# Patient Record
Sex: Male | Born: 1955 | Race: White | Hispanic: No | Marital: Married | Smoking: Never smoker
Health system: Southern US, Community
[De-identification: ages and names within clinical notes are randomized; demographics above are authoritative.]

## PROBLEM LIST (undated history)

## (undated) DIAGNOSIS — E7800 Pure hypercholesterolemia, unspecified: Secondary | ICD-10-CM

## (undated) DIAGNOSIS — I10 Essential (primary) hypertension: Secondary | ICD-10-CM

## (undated) DIAGNOSIS — G809 Cerebral palsy, unspecified: Secondary | ICD-10-CM

---

## 2013-10-14 ENCOUNTER — Emergency Department (INDEPENDENT_AMBULATORY_CARE_PROVIDER_SITE_OTHER)
Admission: EM | Admit: 2013-10-14 | Discharge: 2013-10-14 | Disposition: A | Discharge status: Home / Self Care | Payer: BC Managed Care – PPO | Source: Home / Self Care | Attending: Family Medicine | Admitting: Family Medicine

## 2013-10-14 ENCOUNTER — Encounter: Payer: Self-pay | Admitting: Emergency Medicine

## 2013-10-14 DIAGNOSIS — J069 Acute upper respiratory infection, unspecified: Secondary | ICD-10-CM

## 2013-10-14 DIAGNOSIS — R062 Wheezing: Secondary | ICD-10-CM

## 2013-10-14 HISTORY — DX: Pure hypercholesterolemia, unspecified: E78.00

## 2013-10-14 MED ORDER — DOXYCYCLINE HYCLATE 100 MG PO CAPS: 100.0000 mg | ORAL_CAPSULE | Freq: Two times a day (BID) | ORAL | Status: AC

## 2013-10-14 MED ORDER — METHYLPREDNISOLONE ACETATE 80 MG/ML IJ SUSP
80.0000 mg | Freq: Once | INTRAMUSCULAR | Status: AC
  Administered 2013-10-14: 80 mg via INTRAMUSCULAR

## 2013-10-14 NOTE — ED Notes (Signed)
Pt has had cough, nasal and chest congestion for 3 days.  Cough is sometimes productive and clear or yellow

## 2013-10-14 NOTE — ED Provider Notes (Signed)
CSN: [id number]     Arrival date & time 10/14/13  1140 History   First MD Initiated Contact with Patient 10/14/13 1153     Chief Complaint  Patient presents with  . Cough  . Nasal Congestion    HPI URI Symptoms Onset: 3-4 days  Description: rhinorrhea, nasal congestion, cough, mild wheezing  Modifying factors:  Baseline ? Asthma   Symptoms Nasal discharge: yes Fever: no Sore throat: no Cough: yes Wheezing: yes Ear pain: no GI symptoms: no Sick contacts: no  Red Flags  Stiff neck: no Dyspnea: no Rash: no Swallowing difficulty: no  Sinusitis Risk Factors Headache/face pain: no Double sickening: no tooth pain: no  Allergy Risk Factors Sneezing: no Itchy scratchy throat: no Seasonal symptoms: no  Flu Risk Factors Headache: no muscle aches: n severe fatigue: no   Past Medical History  Diagnosis Date  . High cholesterol    History reviewed. No pertinent past surgical history. History reviewed. No pertinent family history. History  Substance Use Topics  . Smoking status: Never Smoker   . Smokeless tobacco: Not on file  . Alcohol Use: No    Review of Systems  All other systems reviewed and are negative.    Allergies  Erythromycin  Home Medications   Current Outpatient Rx  Name  Route  Sig  Dispense  Refill  . esomeprazole (NEXIUM) 40 MG capsule   Oral   Take 40 mg by mouth daily at 12 noon.         Marland Kitchen HYDROcodone-acetaminophen (NORCO/VICODIN) 5-325 MG per tablet   Oral   Take 1 tablet by mouth every 6 (six) hours as needed for moderate pain.         . metaxalone (SKELAXIN) 800 MG tablet   Oral   Take 800 mg by mouth 3 (three) times daily.         . naproxen sodium (ANAPROX) 220 MG tablet   Oral   Take 220 mg by mouth 2 (two) times daily with a meal.         . pravastatin (PRAVACHOL) 20 MG tablet   Oral   Take 20 mg by mouth daily.         . silodosin (RAPAFLO) 8 MG CAPS capsule   Oral   Take 8 mg by mouth daily with  breakfast.          BP 121/90  Pulse 87  Temp(Src) 97.9 F (36.6 C) (Oral)  Ht 5\' 10"  (1.778 m)  Wt 199 lb 8 oz (90.493 kg)  BMI 28.63 kg/m2  SpO2 95% Physical Exam  Constitutional: He appears well-developed and well-nourished.  HENT:  Head: Normocephalic and atraumatic.  Right Ear: External ear normal.  Left Ear: External ear normal.  +nasal erythema, rhinorrhea bilaterally, + post oropharyngeal erythema    Eyes: Conjunctivae are normal. Pupils are equal, round, and reactive to light.  Neck: Normal range of motion. Neck supple.  Cardiovascular: Normal rate and regular rhythm.   Pulmonary/Chest: Effort normal. He has wheezes.  Abdominal: Soft.  Musculoskeletal: Normal range of motion.  Neurological: He is alert.  Skin: Skin is warm.    ED Course  Procedures (including critical care time) Labs Review Labs Reviewed - No data to display Imaging Review No results found.    MDM   1. URI (upper respiratory infection)   2. Wheezing    Likley viral source of sxs.  Depomedrol 80mg  IM x1 given wheezing.  Discussed supportive care and infectious/resp red flags.  PPx rx for doxy if sxs fail to improve/worsen.  Follow up as needed.     The patient and/or caregiver has been counseled thoroughly with regard to treatment plan and/or medications prescribed including dosage, schedule, interactions, rationale for use, and possible side effects and they verbalize understanding. Diagnoses and expected course of recovery discussed and will return if not improved as expected or if the condition worsens. Patient and/or caregiver verbalized understanding.         Doree AlbeeSteven Jalayna Josten, MD 10/14/13 1218

## 2013-12-06 ENCOUNTER — Encounter: Payer: Self-pay | Admitting: Emergency Medicine

## 2013-12-06 ENCOUNTER — Emergency Department (INDEPENDENT_AMBULATORY_CARE_PROVIDER_SITE_OTHER)
Admission: EM | Admit: 2013-12-06 | Discharge: 2013-12-06 | Disposition: A | Discharge status: Home / Self Care | Payer: BC Managed Care – PPO | Source: Home / Self Care | Attending: Family Medicine | Admitting: Family Medicine

## 2013-12-06 DIAGNOSIS — J069 Acute upper respiratory infection, unspecified: Secondary | ICD-10-CM

## 2013-12-06 HISTORY — DX: Essential (primary) hypertension: I10

## 2013-12-06 MED ORDER — PREDNISONE 20 MG PO TABS: 20.0000 mg | ORAL_TABLET | Freq: Two times a day (BID) | ORAL | Status: DC

## 2013-12-06 MED ORDER — BENZONATATE 200 MG PO CAPS: 200.0000 mg | ORAL_CAPSULE | Freq: Every day | ORAL | Status: DC

## 2013-12-06 MED ORDER — CEFDINIR 300 MG PO CAPS: 300.0000 mg | ORAL_CAPSULE | Freq: Two times a day (BID) | ORAL | Status: DC

## 2013-12-06 NOTE — ED Provider Notes (Signed)
CSN: [id number]     Arrival date & time 12/06/13  1650 History   First MD Initiated Contact with Patient 12/06/13 1657     Chief Complaint  Patient presents with  . Sinus Problem      HPI Comments: Yesterday patient became fatigued and developed runny nose and scratchy throat.  Last night he had chills/sweats.  He has now developed a mild cough and his ears feel clogged.  The history is provided by the patient.    Past Medical History  Diagnosis Date  . High cholesterol   . Hypertension     untreated   History reviewed. No pertinent past surgical history. History reviewed. No pertinent family history. History  Substance Use Topics  . Smoking status: Never Smoker   . Smokeless tobacco: Never Used  . Alcohol Use: No    Review of Systems + sore throat + cough No pleuritic pain + wheezing + nasal congestion + post-nasal drainage No sinus pain/pressure No itchy/red eyes No earache No hemoptysis No SOB No fever, + chills/sweats No nausea No vomiting No abdominal pain No diarrhea No urinary symptoms No skin rash + fatigue + myalgias No headache Used OTC meds without relief  Allergies  Erythromycin  Home Medications   Current Outpatient Rx  Name  Route  Sig  Dispense  Refill  . HYDROcodone-acetaminophen (NORCO/VICODIN) 5-325 MG per tablet   Oral   Take 1 tablet by mouth every 6 (six) hours as needed for moderate pain.         . metaxalone (SKELAXIN) 800 MG tablet   Oral   Take 800 mg by mouth 3 (three) times daily.         . naproxen sodium (ANAPROX) 220 MG tablet   Oral   Take 220 mg by mouth 2 (two) times daily with a meal.         . benzonatate (TESSALON) 200 MG capsule   Oral   Take 1 capsule (200 mg total) by mouth at bedtime. Take as needed for cough   12 capsule   0   . cefdinir (OMNICEF) 300 MG capsule   Oral   Take 1 capsule (300 mg total) by mouth 2 (two) times daily. (Rx void after 12/13/13)   20 capsule   0   . esomeprazole  (NEXIUM) 40 MG capsule   Oral   Take 40 mg by mouth daily at 12 noon.         . pravastatin (PRAVACHOL) 20 MG tablet   Oral   Take 20 mg by mouth daily.         . predniSONE (DELTASONE) 20 MG tablet   Oral   Take 1 tablet (20 mg total) by mouth 2 (two) times daily. Take with food.   10 tablet   0   . silodosin (RAPAFLO) 8 MG CAPS capsule   Oral   Take 8 mg by mouth daily with breakfast.          BP 149/96  Pulse 92  Temp(Src) 98.1 F (36.7 C) (Oral)  Resp 16  Wt 200 lb (90.719 kg)  SpO2 97% Physical Exam Nursing notes and Vital Signs reviewed. Appearance:  Patient appears stated age, and in no acute distress Eyes:  Pupils are equal, round, and reactive to light and accomodation.  Extraocular movement is intact.  Conjunctivae are not inflamed  Ears:  Canals normal.  Tympanic membranes normal.  Nose:  Mildly congested turbinates.  No sinus tenderness.   Pharynx:  Normal Neck:  Supple.   Enlarged non-tender posterior nodes are palpated bilaterally  Lungs:  Clear to auscultation.  Breath sounds are equal.  Heart:  Regular rate and rhythm without murmurs, rubs, or gallops.  Abdomen:  Nontender without masses or hepatosplenomegaly.  Bowel sounds are present.  No CVA or flank tenderness.  Extremities:  No edema.  No calf tenderness Skin:  No rash present.   ED Course  Procedures  none      MDM   1. Acute upper respiratory infections of unspecified site; suspect early viral URI   (Note elevated BP)  There is no evidence of bacterial infection today.  Begin prednisone burst.  Prescription written for Benzonatate (Tessalon) to take at bedtime for night-time cough.  Take plain Mucinex (1200 mg guaifenesin) twice daily for cough and congestion.  Increase fluid intake, rest. May use Afrin nasal spray (or generic oxymetazoline) twice daily for about 5 days.  Also recommend using saline nasal spray several times daily and saline nasal irrigation (AYR is a common brand) Try  warm salt water gargles for sore throat.  Stop all antihistamines for now, and other non-prescription cough/cold preparations. May take Tylenol, or Ibuprofen 200mg , 4 tabs every 8 hours with food for body aches, fever, etc.   Begin Cefdinir if not improving about one week or if persistent fever develops (Given a prescription to hold, with an expiration date)  Follow-up with family doctor if not improving 7 to 10 days.  Follow-up with family doctor for blood pressure.    Lattie HawStephen A Beese, MD 12/06/13 [phone]

## 2013-12-06 NOTE — ED Notes (Signed)
[facility] c/o congestion, sneezing, runny nose, allergy symptoms without fever x yesterday. Taken OTC [facility] generic allergy relief.

## 2013-12-06 NOTE — Discharge Instructions (Signed)
Take plain Mucinex (1200 mg guaifenesin) twice daily for cough and congestion.  Increase fluid intake, rest. May use Afrin nasal spray (or generic oxymetazoline) twice daily for about 5 days.  Also recommend using saline nasal spray several times daily and saline nasal irrigation (AYR is a common brand) Try warm salt water gargles for sore throat.  Stop all antihistamines for now, and other non-prescription cough/cold preparations. May take Tylenol, or Ibuprofen 200mg , 4 tabs every 8 hours with food for body aches, fever, etc.   Begin Cefdinir if not improving about one week or if persistent fever develops   Follow-up with family doctor if not improving 7 to 10 days.  Follow-up with family doctor for blood pressure.

## 2014-05-18 ENCOUNTER — Encounter: Payer: Self-pay | Admitting: Emergency Medicine

## 2014-05-18 ENCOUNTER — Emergency Department (INDEPENDENT_AMBULATORY_CARE_PROVIDER_SITE_OTHER)
Admission: EM | Admit: 2014-05-18 | Discharge: 2014-05-18 | Disposition: A | Discharge status: Home / Self Care | Payer: BC Managed Care – PPO | Source: Home / Self Care | Attending: Emergency Medicine | Admitting: Emergency Medicine

## 2014-05-18 DIAGNOSIS — L02419 Cutaneous abscess of limb, unspecified: Secondary | ICD-10-CM

## 2014-05-18 DIAGNOSIS — L03115 Cellulitis of right lower limb: Secondary | ICD-10-CM

## 2014-05-18 DIAGNOSIS — L03119 Cellulitis of unspecified part of limb: Secondary | ICD-10-CM

## 2014-05-18 MED ORDER — DOXYCYCLINE HYCLATE 100 MG PO CAPS: 100.0000 mg | ORAL_CAPSULE | Freq: Two times a day (BID) | ORAL | Status: DC

## 2014-05-18 MED ORDER — CEFTRIAXONE SODIUM 1 G IJ SOLR
1.0000 g | INTRAMUSCULAR | Status: AC
  Administered 2014-05-18: 1 g via INTRAMUSCULAR

## 2014-05-18 NOTE — ED Notes (Signed)
Pt c/o RT foot pain, redness and swelling x last night. Denies injury. He reports a hx of cellulitis.

## 2014-05-18 NOTE — ED Provider Notes (Signed)
CSN: [id number]     Arrival date & time 05/18/14  0836 History   First MD Initiated Contact with Patient 05/18/14 [phone]     Chief Complaint  Patient presents with  . Foot Pain   (Consider location/radiation/quality/duration/timing/severity/associated sxs/prior Treatment) HPI Recalls no specific injury. Complains of 2 days of progressively worsening right medial foot and lower leg pain redness and swelling. No open wound or drainage or bleeding. The pain is moderate, soreness. Worse to touch. He is able to weight-bear  Denies numbness or tingling or focal neurologic symptoms Denies history of gout or arthritis or joint abnormalities. A couple years ago, had similar symptoms and was diagnosed with cellulitis by his doctor, which resolved with an antibiotic  Past Medical History  Diagnosis Date  . High cholesterol   . Hypertension     untreated   History reviewed. No pertinent past surgical history. History reviewed. No pertinent family history. History  Substance Use Topics  . Smoking status: Never Smoker   . Smokeless tobacco: Never Used  . Alcohol Use: No    Review of Systems  All other systems reviewed and are negative.   Allergies  Erythromycin  Home Medications   Prior to Admission medications   Medication Sig Start Date End Date Taking? Authorizing Provider  benzonatate (TESSALON) 200 MG capsule Take 1 capsule (200 mg total) by mouth at bedtime. Take as needed for cough 12/06/13   Lattie Haw, MD  cefdinir (OMNICEF) 300 MG capsule Take 1 capsule (300 mg total) by mouth 2 (two) times daily. (Rx void after 12/13/13) 12/06/13   Lattie Haw, MD  doxycycline (VIBRAMYCIN) 100 MG capsule Take 1 capsule (100 mg total) by mouth 2 (two) times daily. 05/18/14   Lajean Manes, MD  esomeprazole (NEXIUM) 40 MG capsule Take 40 mg by mouth daily at 12 noon.    Historical Provider, MD  HYDROcodone-acetaminophen (NORCO/VICODIN) 5-325 MG per tablet Take 1 tablet by mouth every 6 (six)  hours as needed for moderate pain.    Historical Provider, MD  metaxalone (SKELAXIN) 800 MG tablet Take 800 mg by mouth 3 (three) times daily.    Historical Provider, MD  naproxen sodium (ANAPROX) 220 MG tablet Take 220 mg by mouth 2 (two) times daily with a meal.    Historical Provider, MD  pravastatin (PRAVACHOL) 20 MG tablet Take 20 mg by mouth daily.    Historical Provider, MD  predniSONE (DELTASONE) 20 MG tablet Take 1 tablet (20 mg total) by mouth 2 (two) times daily. Take with food. 12/06/13   Lattie Haw, MD  silodosin (RAPAFLO) 8 MG CAPS capsule Take 8 mg by mouth daily with breakfast.    Historical Provider, MD   BP 119/83  Pulse 116  Temp(Src) 97.5 F (36.4 C) (Oral)  Resp 18  Wt 201 lb (91.173 kg)  SpO2 98% Physical Exam  Nursing note and vitals reviewed. Constitutional: He is oriented to person, place, and time. He appears well-developed and well-nourished. No distress.  HENT:  Head: Normocephalic and atraumatic.  Eyes: Conjunctivae and EOM are normal. Pupils are equal, round, and reactive to light. No scleral icterus.  Neck: Normal range of motion.  Cardiovascular: Normal rate.   Pulmonary/Chest: Effort normal.  Abdominal: He exhibits no distension.  Musculoskeletal: Normal range of motion.       Right lower leg: He exhibits tenderness and swelling. He exhibits no bony tenderness, no edema, no deformity and no laceration.       Legs: Right  anterior lower leg: Skin is warm, red, indurated with 1 red streak as depicted. No fluctuance. No open wound. No calf tenderness. Homans sign negative. No foot tenderness or swelling. No joint abnormalities or tenderness noted. Range of motion intact. Neurovascular distally intact.  No tenderness or swelling right popliteal area. No tenderness or abnormality of right thigh. No inguinal adenopathy.  Neurological: He is alert and oriented to person, place, and time.  Skin: Skin is warm.  Psychiatric: He has a normal mood and  affect.   Pulse rechecked at 88 ED Course  Procedures (including critical care time) Labs Review Labs Reviewed - No data to display  Imaging Review No results found.   MDM   1. Cellulitis of leg, right    I also suspect early ascending lymphangitis right lower leg. No systemic symptoms or findings.  Treatment options discussed, as well as risks, benefits, alternatives. Patient voiced understanding and agreement with the following plans: Rocephin 1 g IM stat Doxycycline 100 mg twice a day Keep right leg elevated  Follow-up with your primary care doctor in 2-3 days if not improving, or sooner if symptoms become worse. Precautions discussed. Red flags discussed.--- Emergency room if any red flags Questions invited and answered. Patient voiced understanding and agreement.     Lajean Manes, MD 05/18/14 [phone]

## 2014-05-24 ENCOUNTER — Ambulatory Visit (HOSPITAL_BASED_OUTPATIENT_CLINIC_OR_DEPARTMENT_OTHER): Payer: BC Managed Care – PPO | Attending: Emergency Medicine

## 2014-05-24 ENCOUNTER — Emergency Department (INDEPENDENT_AMBULATORY_CARE_PROVIDER_SITE_OTHER)
Admission: EM | Admit: 2014-05-24 | Discharge: 2014-05-24 | Disposition: A | Discharge status: Home / Self Care | Payer: BC Managed Care – PPO | Source: Home / Self Care | Attending: Emergency Medicine | Admitting: Emergency Medicine

## 2014-05-24 ENCOUNTER — Encounter: Payer: Self-pay | Admitting: Emergency Medicine

## 2014-05-24 DIAGNOSIS — L03115 Cellulitis of right lower limb: Secondary | ICD-10-CM

## 2014-05-24 DIAGNOSIS — I83893 Varicose veins of bilateral lower extremities with other complications: Secondary | ICD-10-CM | POA: Insufficient documentation

## 2014-05-24 DIAGNOSIS — L03119 Cellulitis of unspecified part of limb: Secondary | ICD-10-CM

## 2014-05-24 DIAGNOSIS — M79609 Pain in unspecified limb: Secondary | ICD-10-CM

## 2014-05-24 DIAGNOSIS — L03125 Acute lymphangitis of right lower limb: Secondary | ICD-10-CM

## 2014-05-24 DIAGNOSIS — R609 Edema, unspecified: Secondary | ICD-10-CM | POA: Diagnosis present

## 2014-05-24 DIAGNOSIS — L02419 Cutaneous abscess of limb, unspecified: Secondary | ICD-10-CM

## 2014-05-24 DIAGNOSIS — M79604 Pain in right leg: Secondary | ICD-10-CM

## 2014-05-24 MED ORDER — AMOXICILLIN-POT CLAVULANATE 875-125 MG PO TABS: 1.0000 | ORAL_TABLET | Freq: Two times a day (BID) | ORAL | Status: DC

## 2014-05-24 MED ORDER — CEFTRIAXONE SODIUM 1 G IJ SOLR
1.0000 g | Freq: Once | INTRAMUSCULAR | Status: AC
  Administered 2014-05-24: 1 g via INTRAMUSCULAR

## 2014-05-24 NOTE — ED Notes (Signed)
Follow up visit from a week ago for same problem.  Redness with pitting edema to right lower leg.  Patient states that although there has been some improvement he is still having the symptoms mentioned.  Patient also reports a "knot" to posterior mid calf area and is painful.

## 2014-05-24 NOTE — ED Provider Notes (Signed)
CSN: [id number]     Arrival date & time 05/24/14  1703 History   First MD Initiated Contact with Patient 05/24/14 1743     Chief Complaint  Patient presents with  . Leg Pain    HPI Mr. Wishon was seen here at [facility] urgent care on 05/18/14, diagnosed with cellulitis, suspicion of early ascending lymphangitis right lower leg. He had no cords or calf pain and Homans sign was negative and he had no signs or symptoms of DVT at that time. I've extensively reviewed my notes from that visit on 9/11.--He received Rocephin 1 g IM then, and a prescription for doxycycline 100 twice a day, which he continues to take  Mr. Plitt returns here to [facility] urgent care, complaining that the right leg pain initially improved a little with rest and elevation and treatment for a few days after 9/11, however he returned to work for the past several days and has worked long days on his feet everyday and notes at the end of the day the swelling and pain worsens. Today, the swelling and pain has worsened to a pain level of 5/10 (he states that back in 9/11, the pain level was 7/10). Just today, he noted some hard knots in the medial aspect of right leg. The right leg has stayed warm and firm and red, but no swelling or pain or redness above the right knee. He denies chest pain or shortness of breath or palpitations syncope or focal neurologic symptoms. Past Medical History  Diagnosis Date  . High cholesterol   . Hypertension     untreated   History reviewed. No pertinent past surgical history. History reviewed. No pertinent family history. History  Substance Use Topics  . Smoking status: Never Smoker   . Smokeless tobacco: Never Used  . Alcohol Use: No    Review of Systems  All other systems reviewed and are negative.   Allergies  Erythromycin  Home Medications   Prior to Admission medications   Medication Sig Start Date End Date Taking? Authorizing Provider  amoxicillin-clavulanate  (AUGMENTIN) 875-125 MG per tablet Take 1 tablet by mouth 2 (two) times daily. For 10 days. Take with food. 05/24/14   Lajean Manes, MD  benzonatate (TESSALON) 200 MG capsule Take 1 capsule (200 mg total) by mouth at bedtime. Take as needed for cough 12/06/13   Lattie Haw, MD  cefdinir (OMNICEF) 300 MG capsule Take 1 capsule (300 mg total) by mouth 2 (two) times daily. (Rx void after 12/13/13) 12/06/13   Lattie Haw, MD  doxycycline (VIBRAMYCIN) 100 MG capsule Take 1 capsule (100 mg total) by mouth 2 (two) times daily. 05/18/14   Lajean Manes, MD  esomeprazole (NEXIUM) 40 MG capsule Take 40 mg by mouth daily at 12 noon.    Historical Provider, MD  HYDROcodone-acetaminophen (NORCO/VICODIN) 5-325 MG per tablet Take 1 tablet by mouth every 6 (six) hours as needed for moderate pain.    Historical Provider, MD  metaxalone (SKELAXIN) 800 MG tablet Take 800 mg by mouth 3 (three) times daily.    Historical Provider, MD  naproxen sodium (ANAPROX) 220 MG tablet Take 220 mg by mouth 2 (two) times daily with a meal.    Historical Provider, MD  pravastatin (PRAVACHOL) 20 MG tablet Take 20 mg by mouth daily.    Historical Provider, MD  predniSONE (DELTASONE) 20 MG tablet Take 1 tablet (20 mg total) by mouth 2 (two) times daily. Take with food. 12/06/13   Lattie Haw,  MD  silodosin (RAPAFLO) 8 MG CAPS capsule Take 8 mg by mouth daily with breakfast.    Historical Provider, MD   BP 128/90  Pulse 73  Temp(Src) 97.5 F (36.4 C) (Oral)  Resp 16  Ht  (1.778 m)  Wt 197 lb (89.359 kg)  BMI 28.27 kg/m2  SpO2 99% Physical Exam  Nursing note and vitals reviewed. Constitutional: He is oriented to person, place, and time. He appears well-developed and well-nourished. No distress.  Uncomfortable from right leg pain, but no acute distress. He can ambulate normally.  HENT:  Head: Normocephalic and atraumatic.  Mouth/Throat: Oropharynx is clear and moist.  Eyes: Conjunctivae and EOM are normal. Pupils are  equal, round, and reactive to light. No scleral icterus.  Neck: Normal range of motion.  Cardiovascular: Normal rate, regular rhythm and normal heart sounds.   Pulmonary/Chest: Effort normal and breath sounds normal.  Abdominal: He exhibits no distension.  Musculoskeletal: Normal range of motion.       Right ankle: Normal. No tenderness.       Right lower leg: He exhibits tenderness and edema (Trace). He exhibits no bony tenderness, no swelling, no deformity and no laceration.       Legs:      Right foot: Normal. He exhibits no tenderness, no bony tenderness, no swelling and normal capillary refill.  As depicted, Right lower leg:  Anterior -medial induration, redness, heat, moderate tenderness, with a firm linear cord palpated longitudinally. No fluctuance or drainage or bleeding or open wound. Trace pretibial edema. Negative Homans sign. No posterior calf tenderness or swelling or heat or cords. No popliteal swelling or tenderness or redness or heat or cords. Right knee exam within normal limits. Right thigh exam normal. Nontender without swelling or heat or cords.   Lymphadenopathy:       Right: No inguinal adenopathy present.       Left: No inguinal adenopathy present.  Neurological: He is alert and oriented to person, place, and time.  Neurovascular distally intact lower extremities  Skin: Skin is warm. He is not diaphoretic.  Psychiatric: He has a normal mood and affect.    ED Course  Procedures (including critical care time) Labs Review Labs Reviewed - No data to display  MDM   1. Leg pain, right   2. Cellulitis of leg, right   3. Acute lymphangitis of right lower extremity     05/24/2014 5:45 PM Explained to patient: Primary diagnosis is still likely cellulitis and lymphangitis right leg, however because he still has pain and swelling right leg ,with tender cords right anterior medial leg, we need to rule out DVT.  His response to treatment has been complicated by his  continuing to work and being on his feet all day for the past 3 days which has exacerbated the pain and swelling. Therefore, we called and arranged stat ultrasound -venous Doppler of right leg at [facility] (this imaging test is not available here in our facility in [place] at this time the evening.) Instructed patient to go to [facility] now (written and verbal instructions), Right leg venous Doppler will be done stat.--- If the test is positive for DVT, he will need to start Lovenox. If negative, we can offer the option of  evaluation and treatment by EDP at [facility] ER , with the option of Rocephin 1 g IM , or the option of followup here at [facility] urgent care tomorrow morning--I have gone  ahead and sent an additional antibiotic to his pharmacy: Augmentin 875 twice a day, which gives aerobic and anaerobic coverage-This is in addition to the doxycycline that he started taking.--He voiced understanding and agreement with this plan. I wrote a handwritten note excusing from work 9/17 through 05/28/14. I explained the importance of keeping his right leg elevated, as his going to work and being on his feet all day has tended to exacerbate the pain and swelling.  7:20 PM. Phone report that ultrasound right leg shows no evidence of DVT.  05/24/2014   CLINICAL DATA:  Right lower extremity redness and edema.  EXAM: RIGHT LOWER EXTREMITY VENOUS DOPPLER ULTRASOUND  TECHNIQUE: Gray-scale sonography with graded compression, as well as color Doppler and duplex ultrasound were performed to evaluate the lower extremity deep venous systems from the level of the common femoral vein and including the common femoral, femoral, profunda femoral, popliteal and calf veins including the posterior tibial, peroneal and gastrocnemius veins when visible. The superficial great saphenous vein was also interrogated. Spectral Doppler was utilized to evaluate flow at rest and with distal  augmentation maneuvers in the common femoral, femoral and popliteal veins.  COMPARISON:  None.  FINDINGS: Common Femoral Vein: No evidence of thrombus. Normal compressibility, respiratory phasicity and response to augmentation.  Saphenofemoral Junction: No evidence of thrombus. Normal compressibility and flow on color Doppler imaging.  Profunda Femoral Vein: No evidence of thrombus. Normal compressibility and flow on color Doppler imaging.  Femoral Vein: No evidence of thrombus. Normal compressibility, respiratory phasicity and response to augmentation.  Popliteal Vein: No evidence of thrombus. Normal compressibility, respiratory phasicity and response to augmentation.  Calf Veins: No evidence of thrombus. Normal compressibility and flow on color Doppler imaging.  Superficial Great Saphenous Vein: No evidence of thrombus. Normal compressibility and flow on color Doppler imaging.  Venous Reflux:  None.  Other Findings: The area of palpable concern correlates with superficial varicose veins.  IMPRESSION: 1. No evidence for deep vein thrombosis. 2. Palpable abnormality in the calf is varicose vein.   Electronically Signed   By: Rosalie Gums M.D.   On: 05/24/2014 18:58   I called and explained to patient that ultrasound negative for DVT. He'll drive back to [facility] urgent care now for Rocephin 1 g IM stat. Augmentin 875 twice a day. See other plans as noted in my note of 5:45 PM above.  7:35 PM. Patient returned here at [facility] urgent care and we discussed the above at length. Questions invited and answered. Rocephin 1 g IM stat given. His wife has already picked up the Augmentin prescription. He has some hydrocodone left over from prior prescription, to use when necessary severe pain, but otherwise Tylenol when necessary pain.  He prefers to followup for this problem here at [facility] urgent care on Monday 9/21, advise going to emergency room if any worsening or new symptoms or any red  flags Precautions discussed. Red flags discussed. Questions invited and answered. Patient voiced understanding and agreement.  Over 40 minutes spent today, greater than 50% of the time spent for counseling and coordination of care.     Lajean Manes, MD 05/24/14 1949

## 2014-05-26 DIAGNOSIS — I83893 Varicose veins of bilateral lower extremities with other complications: Secondary | ICD-10-CM | POA: Diagnosis not present

## 2014-05-27 ENCOUNTER — Telehealth: Payer: Self-pay | Admitting: Emergency Medicine

## 2014-05-28 ENCOUNTER — Encounter: Payer: Self-pay | Admitting: Emergency Medicine

## 2014-05-28 ENCOUNTER — Emergency Department (INDEPENDENT_AMBULATORY_CARE_PROVIDER_SITE_OTHER)
Admission: EM | Admit: 2014-05-28 | Discharge: 2014-05-28 | Disposition: A | Discharge status: Home / Self Care | Payer: BC Managed Care – PPO | Source: Home / Self Care | Attending: Emergency Medicine | Admitting: Emergency Medicine

## 2014-05-28 DIAGNOSIS — L03115 Cellulitis of right lower limb: Secondary | ICD-10-CM

## 2014-05-28 DIAGNOSIS — L02419 Cutaneous abscess of limb, unspecified: Secondary | ICD-10-CM

## 2014-05-28 DIAGNOSIS — L03129 Acute lymphangitis of unspecified part of limb: Secondary | ICD-10-CM

## 2014-05-28 DIAGNOSIS — L03119 Cellulitis of unspecified part of limb: Secondary | ICD-10-CM

## 2014-05-28 HISTORY — DX: Cerebral palsy, unspecified: G80.9

## 2014-05-28 NOTE — ED Provider Notes (Signed)
CSN: [id number]     Arrival date & time 05/28/14  4782 History   First MD Initiated Contact with Patient 05/28/14 [phone]     Chief Complaint  Patient presents with  . Wound Check   (Consider location/radiation/quality/duration/timing/severity/associated sxs/prior Treatment) HPI Here to recheck right leg infection, cellulitis, ascending lymphangitis. Reviewed extensive notes and history from 05/24/14. Since then, he feels no significant improvement. Still swollen and red and swelling worsens when he stands up and improves somewhat when he elevates right leg. No definite fever or chills. Past Medical History  Diagnosis Date  . High cholesterol   . Hypertension     untreated  . Cerebral palsy    History reviewed. No pertinent past surgical history. History reviewed. No pertinent family history. History  Substance Use Topics  . Smoking status: Never Smoker   . Smokeless tobacco: Never Used  . Alcohol Use: No    Review of Systems  All other systems reviewed and are negative.   Allergies  Erythromycin  Home Medications   Prior to Admission medications   Medication Sig Start Date End Date Taking? Authorizing Provider  amoxicillin-clavulanate (AUGMENTIN) 875-125 MG per tablet Take 1 tablet by mouth 2 (two) times daily. For 10 days. Take with food. 05/24/14   Lajean Manes, MD  benzonatate (TESSALON) 200 MG capsule Take 1 capsule (200 mg total) by mouth at bedtime. Take as needed for cough 12/06/13   Lattie Haw, MD  doxycycline (VIBRAMYCIN) 100 MG capsule Take 1 capsule (100 mg total) by mouth 2 (two) times daily. 05/18/14   Lajean Manes, MD  esomeprazole (NEXIUM) 40 MG capsule Take 40 mg by mouth daily at 12 noon.    Historical Provider, MD  HYDROcodone-acetaminophen (NORCO/VICODIN) 5-325 MG per tablet Take 1 tablet by mouth every 6 (six) hours as needed for moderate pain.    Historical Provider, MD  metaxalone (SKELAXIN) 800 MG tablet Take 800 mg by mouth 3 (three) times daily.     Historical Provider, MD  naproxen sodium (ANAPROX) 220 MG tablet Take 220 mg by mouth 2 (two) times daily with a meal.    Historical Provider, MD  pravastatin (PRAVACHOL) 20 MG tablet Take 20 mg by mouth daily.    Historical Provider, MD  silodosin (RAPAFLO) 8 MG CAPS capsule Take 8 mg by mouth daily with breakfast.    Historical Provider, MD   BP 179/113  Pulse 72  Temp(Src) 98.3 F (36.8 C) (Oral)  Resp 16  Ht  (1.778 m)  Wt 195 lb (88.451 kg)  BMI 27.98 kg/m2  SpO2 98% Physical Exam  Nursing note and vitals reviewed. Constitutional: He is oriented to person, place, and time. He appears well-developed and well-nourished. No distress.  Uncomfortable from right leg pain, but no acute distress. He can ambulate normally.  HENT:  Head: Normocephalic and atraumatic.  Mouth/Throat: Oropharynx is clear and moist.  Eyes: Conjunctivae and EOM are normal. Pupils are equal, round, and reactive to light. No scleral icterus.  Neck: Normal range of motion.  Cardiovascular: Normal rate, regular rhythm and normal heart sounds.   Pulmonary/Chest: Effort normal and breath sounds normal.  Abdominal: He exhibits no distension.  Musculoskeletal: Normal range of motion.       Right ankle: Normal. No tenderness.       Right lower leg: He exhibits tenderness and edema (Trace). He exhibits no bony tenderness, no swelling, no deformity and no laceration.       Legs:  Right foot: Normal. He exhibits no tenderness, no bony tenderness, no swelling and normal capillary refill.  As depicted, Right lower leg:  Anterior -medial induration, redness, heat, moderate tenderness, with a firm linear cord palpated longitudinally. No fluctuance or drainage or bleeding or open wound. Trace pretibial edema. Negative Homans sign. No posterior calf tenderness or swelling or heat or cords. No popliteal swelling or tenderness or redness or heat or cords. Right knee exam within normal limits. Right thigh exam  normal. Nontender without swelling or heat or cords.   Lymphadenopathy:       Right: No inguinal adenopathy present.       Left: No inguinal adenopathy present.  Neurological: He is alert and oriented to person, place, and time.  Neurovascular distally intact lower extremities  Skin: Skin is warm. He is not diaphoretic.  Psychiatric: He has a normal mood and affect.   BP rechecked, 162/98.--Advised BP followup with PCP within 1-2 weeks   ED Course  Procedures (including critical care time) Labs Review Labs Reviewed - No data to display  Imaging Review No results found. No results found for this or any previous visit.   MDM   1. Cellulitis of leg, right   2. Lymphangitis, acute, lower leg     Discussed options at length. No significant improvement, despite aggressive outpatient treatment. Still red swollen tender right leg. Prominent varicosity, NEG FOR DVT,by venous Doppler on Thursday 9/17.For now, keep right leg elevated an Ace wrap. Wrote a note excusing from work for 7 days, as  standing up and walking at work would make right leg swell more and would inhibit him from improving . After discussion of options, will refer to specialist at [facility], as [facility] is his preference. In the meantime, continue Augmentin. Keep right leg elevated. He declined another shot of Rocephin here today.  Addendum: 11 AM after multiple phone calls attempting referral to specialist at [facility], through physician access line, I spoke with Dr. Arlyn Leak, infectious disease specialist and explained details of patient's history at length. Dr. Anner Crete advised that aggressive treatment as an outpatient has been appropriate, but at this point he would need IV antibiotics. Dr. Anner Crete advised to have patient go to [facility] emergency room now, as he will likely need admission for IV antibiotics.         Lajean Manes, MD 05/28/14  1147

## 2014-05-28 NOTE — ED Notes (Signed)
Pt is here for a wound check of his RLE infection. He reports that it is some better.

## 2014-05-29 ENCOUNTER — Telehealth: Payer: Self-pay | Admitting: *Deleted

## 2016-02-06 IMAGING — US US EXTREM LOW VENOUS*R*
1 series · 13 of 24 positions shown · non-contrast
Comparison: None.

CLINICAL DATA: Right lower extremity redness and edema.



[Series 1: us extrem low venous*right* · 0.08mm/px · 13 of 28 slices shown]
[im 1/28]
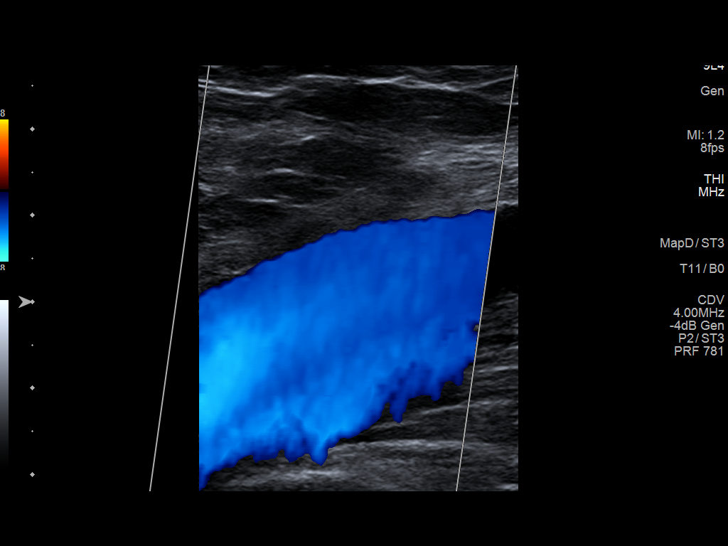
[im 3/28]
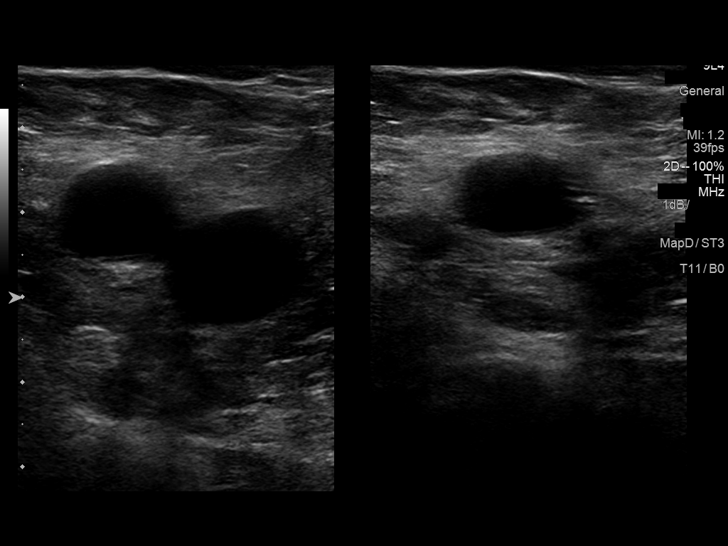
[im 5/28]
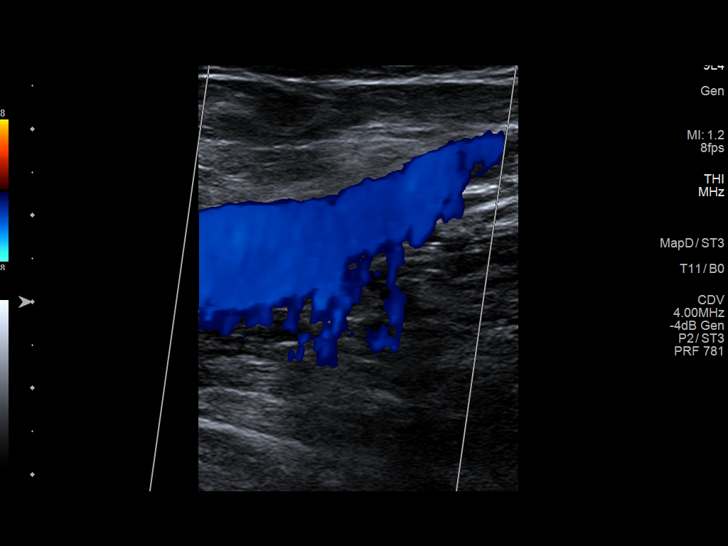
[im 8/28]
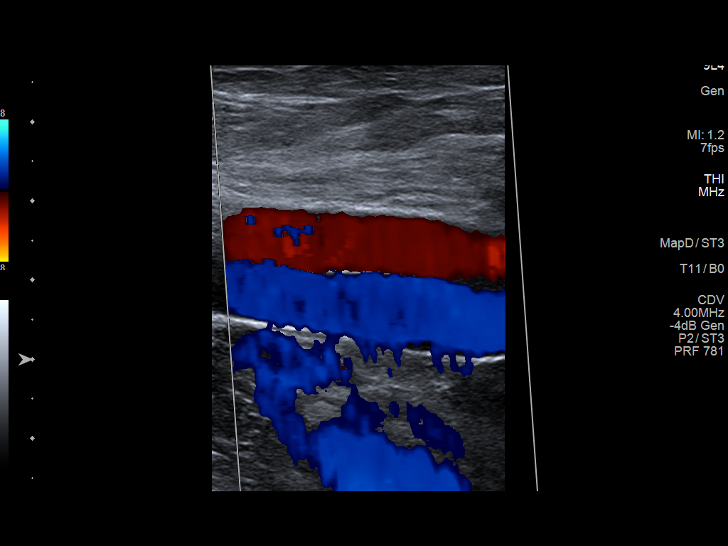
[im 10/28]
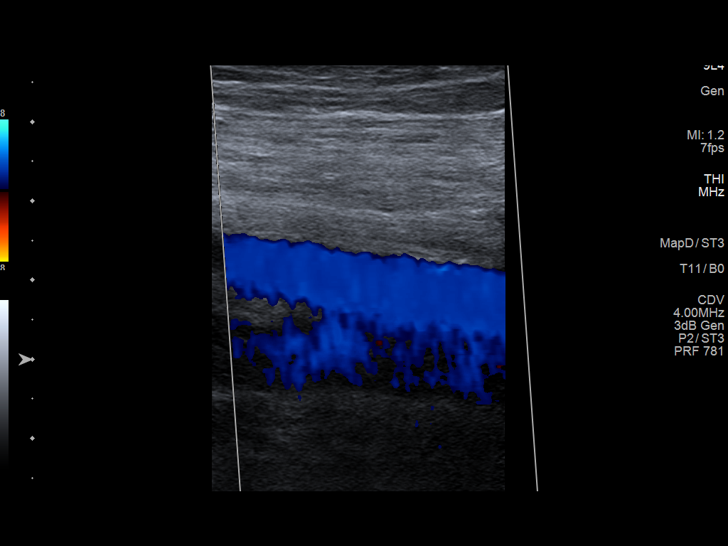
[im 12/28]
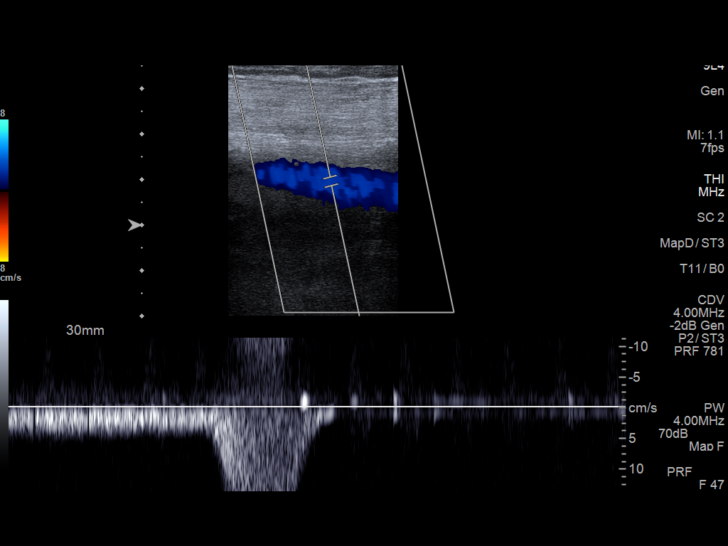
[im 15/28]
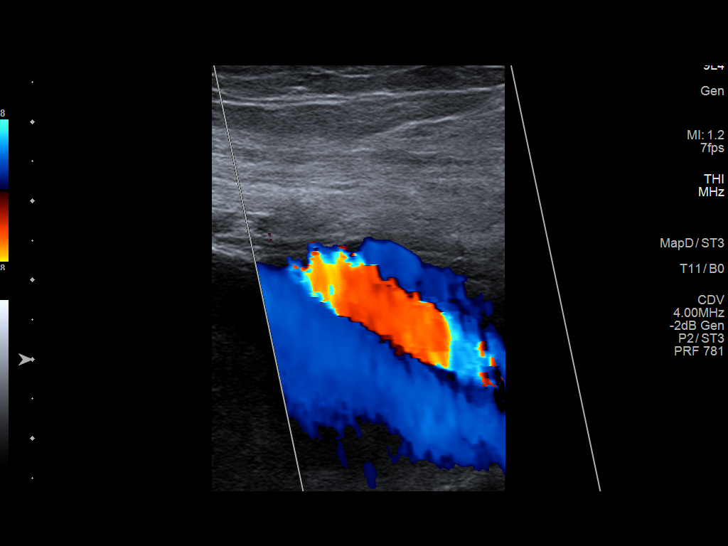
[im 16/28]
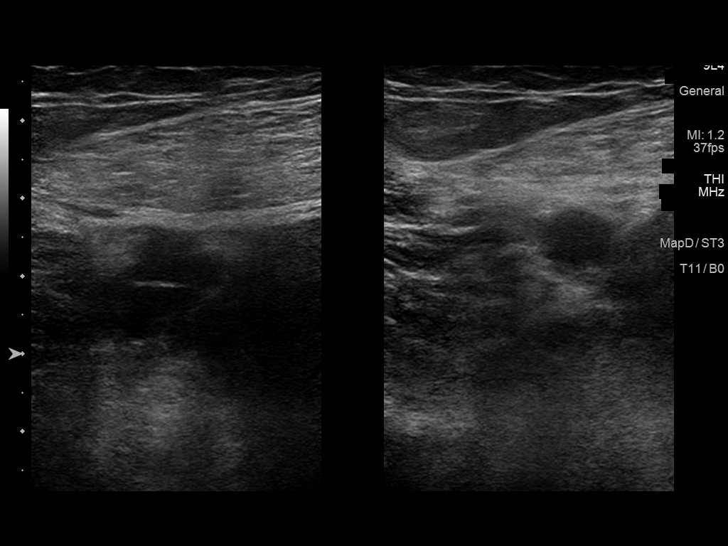
[im 18/28]
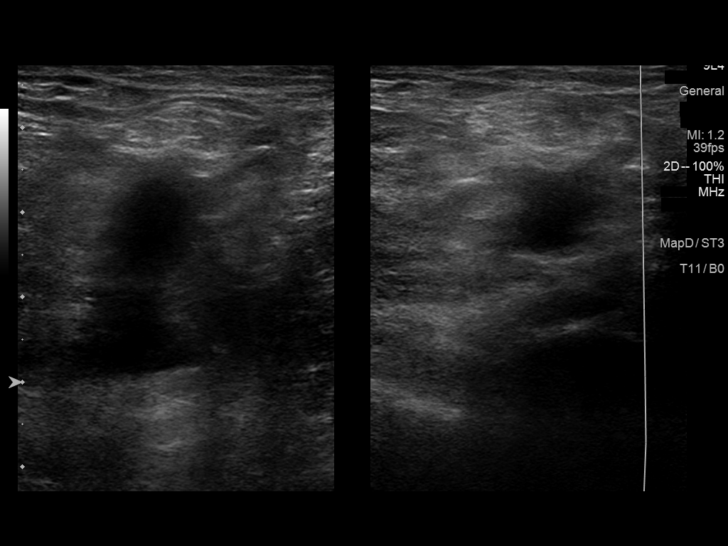
[im 20/28]
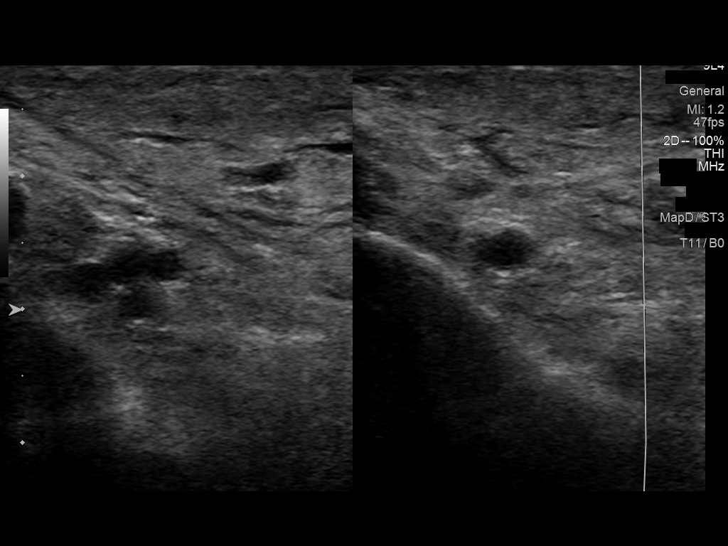
[im 23/28]
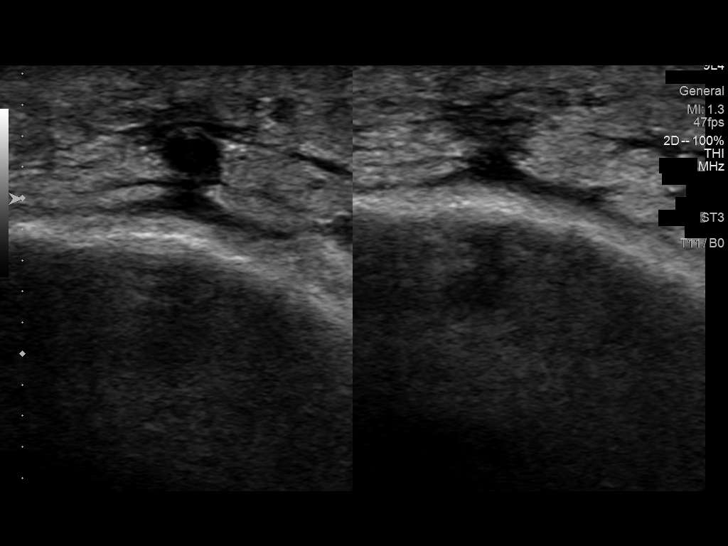
[im 25/28]
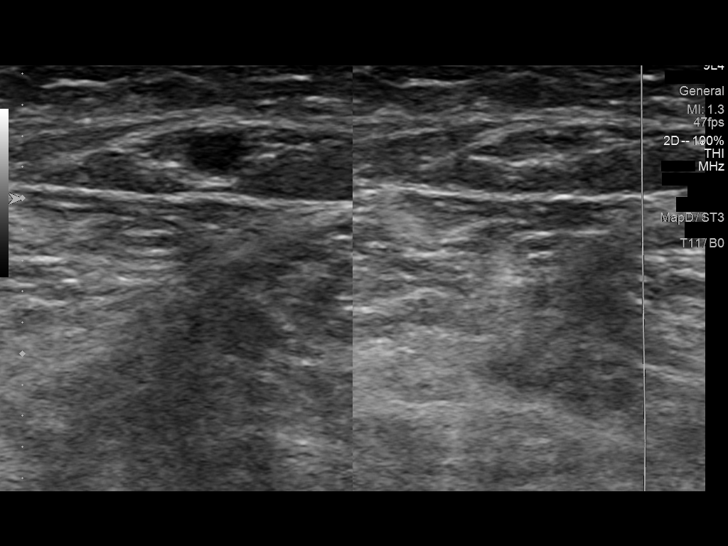
[im 28/28]
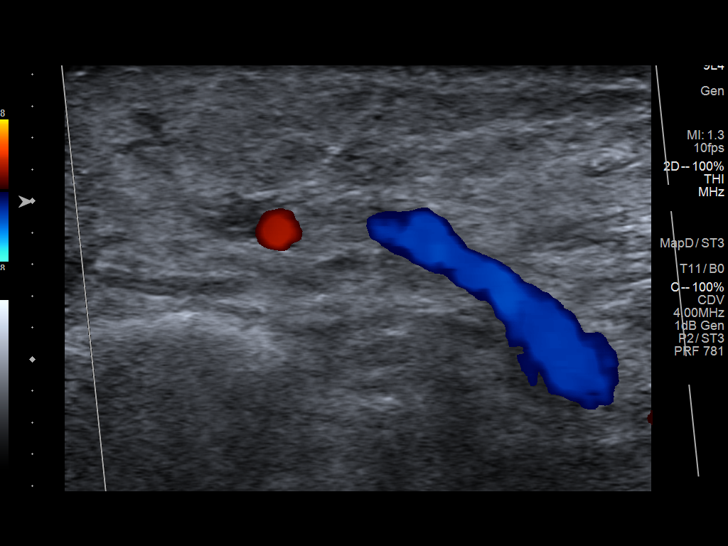

[13 of 24 positions shown; findings below may reference images not displayed]

FINDINGS: Common Femoral Vein: No evidence of thrombus. Normal
compressibility, respiratory phasicity and response to augmentation.

Saphenofemoral Junction: No evidence of thrombus. Normal
compressibility and flow on color Doppler imaging.

Profunda Femoral Vein: No evidence of thrombus. Normal
compressibility and flow on color Doppler imaging.

Femoral Vein: No evidence of thrombus. Normal compressibility,
respiratory phasicity and response to augmentation.

Popliteal Vein: No evidence of thrombus. Normal compressibility,
respiratory phasicity and response to augmentation.

Calf Veins: No evidence of thrombus. Normal compressibility and flow
on color Doppler imaging.

Superficial Great Saphenous Vein: No evidence of thrombus. Normal
compressibility and flow on color Doppler imaging.

Venous Reflux:  None.

Other Findings: The area of palpable concern correlates with
superficial varicose veins.
IMPRESSION: 1. No evidence for deep vein thrombosis.
2. Palpable abnormality in the calf is varicose vein.

## 2018-07-20 ENCOUNTER — Emergency Department (INDEPENDENT_AMBULATORY_CARE_PROVIDER_SITE_OTHER)
Admission: EM | Admit: 2018-07-20 | Discharge: 2018-07-20 | Disposition: A | Discharge status: Home / Self Care | Payer: Managed Care, Other (non HMO) | Source: Home / Self Care | Attending: Family Medicine | Admitting: Family Medicine

## 2018-07-20 ENCOUNTER — Emergency Department (INDEPENDENT_AMBULATORY_CARE_PROVIDER_SITE_OTHER): Payer: Managed Care, Other (non HMO)

## 2018-07-20 ENCOUNTER — Encounter: Payer: Self-pay | Admitting: *Deleted

## 2018-07-20 ENCOUNTER — Other Ambulatory Visit: Payer: Self-pay

## 2018-07-20 DIAGNOSIS — R05 Cough: Secondary | ICD-10-CM | POA: Diagnosis not present

## 2018-07-20 DIAGNOSIS — R053 Chronic cough: Secondary | ICD-10-CM

## 2018-07-20 MED ORDER — DOXYCYCLINE HYCLATE 100 MG PO CAPS: 100.0000 mg | ORAL_CAPSULE | Freq: Two times a day (BID) | ORAL | 0 refills | Status: DC

## 2018-07-20 MED ORDER — GUAIFENESIN-CODEINE 100-10 MG/5ML PO SOLN: ORAL | 0 refills | Status: DC

## 2018-07-20 MED ORDER — METHYLPREDNISOLONE SODIUM SUCC 125 MG IJ SOLR
80.0000 mg | Freq: Once | INTRAMUSCULAR | Status: AC
  Administered 2018-07-20: 80 mg via INTRAMUSCULAR

## 2018-07-20 MED ORDER — PREDNISONE 20 MG PO TABS: ORAL_TABLET | ORAL | 0 refills | Status: DC

## 2018-07-20 MED ORDER — ALBUTEROL SULFATE HFA 108 (90 BASE) MCG/ACT IN AERS: 2.0000 | INHALATION_SPRAY | RESPIRATORY_TRACT | 0 refills | Status: AC | PRN

## 2018-07-20 NOTE — ED Triage Notes (Signed)
Pt c/o lingering cough after his flu dx on 06/28/18. denies fever.

## 2018-07-20 NOTE — ED Provider Notes (Signed)
[facility]    CSN: [id number] Arrival date & time: 07/20/18  1626     History   Chief Complaint Chief Complaint  Patient presents with  . Cough    HPI Paul Wagner is a 62 y.o. male.   Patient was diagnosed with influenza on 06/28/18.  He complains of a lingering productive cough since then, worse at night.  He denies fevers, chills, and sweats, and no pleuritic pain or shortness of breath.  He has a past history of pneumonia, and last year had a PE.  The history is provided by the patient.    Past Medical History:  Diagnosis Date  . Cerebral palsy (HCC)   . High cholesterol   . Hypertension    untreated    Active problems:  GERD and hyperlipidemia   Surgical history:  Nasal surgery, wrist surgery, penile prosthesis, lithotripsy, SVT with ablation     Home Medications    Prior to Admission medications   Medication Sig Start Date End Date Taking? Authorizing Provider  albuterol (PROVENTIL HFA;VENTOLIN HFA) 108 (90 Base) MCG/ACT inhaler Inhale 2 puffs into the lungs every 4 (four) hours as needed for wheezing or shortness of breath. 07/20/18   Lattie Haw, MD  doxycycline (VIBRAMYCIN) 100 MG capsule Take 1 capsule (100 mg total) by mouth 2 (two) times daily. Take with food. 07/20/18   Lattie Haw, MD  esomeprazole (NEXIUM) 40 MG capsule Take 40 mg by mouth daily at 12 noon.    [provider]  guaiFENesin-codeine 100-10 MG/5ML syrup Take 10mL by mouth at bedtime as needed for cough.  May repeat dose in 4 to 6 hours. 07/20/18   Lattie Haw, MD  metaxalone (SKELAXIN) 800 MG tablet Take 800 mg by mouth 3 (three) times daily.    [provider]  naproxen sodium (ANAPROX) 220 MG tablet Take 220 mg by mouth 2 (two) times daily with a meal.    [provider]  pravastatin (PRAVACHOL) 20 MG tablet Take 20 mg by mouth daily.    [provider]  predniSONE (DELTASONE) 20 MG tablet Take one tab by mouth twice  daily for 4 days, then one daily for 3 days. Take with food. 07/20/18   Lattie Haw, MD  silodosin (RAPAFLO) 8 MG CAPS capsule Take 8 mg by mouth daily with breakfast.    [provider]    Family History History reviewed:  Diabetes father and maternal grandmother.  Social History Social History   Tobacco Use  . Smoking status: Never Smoker  . Smokeless tobacco: Never Used  Substance Use Topics  . Alcohol use: No  . Drug use: No     Allergies   Erythromycin   Review of Systems Review of Systems No sore throat + cough No pleuritic pain No wheezing + nasal congestion + post-nasal drainage No sinus pain/pressure No itchy/red eyes No earache No hemoptysis No SOB No fever/chills No nausea No vomiting No abdominal pain No diarrhea No urinary symptoms No skin rash No fatigue No myalgias No headache Used OTC meds without relief   Physical Exam Triage Vital Signs ED Triage Vitals  Enc Vitals Group     BP 07/20/18 1648 (!) 136/91     Pulse Rate 07/20/18 1648 74     Resp 07/20/18 1648 18     Temp 07/20/18 1648 97.8 F (36.6 C)     Temp Source 07/20/18 1648 Oral     SpO2 07/20/18 1648 97 %  Weight 07/20/18 1649 220 lb (99.8 kg)     Height 07/20/18 1649 5\' 10"  (1.778 m)     Head Circumference --      Peak Flow --      Pain Score 07/20/18 1649 0     Pain Loc --      Pain Edu? --      Excl. in GC? --    No data found.  Updated Vital Signs BP (!) 136/91 (BP Location: Right Arm)   Pulse 74   Temp 97.8 F (36.6 C) (Oral)   Resp 18   Ht 5\' 10"  (1.778 m)   Wt 99.8 kg   SpO2 97%   BMI 31.57 kg/m   Visual Acuity Right Eye Distance:   Left Eye Distance:   Bilateral Distance:    Right Eye Near:   Left Eye Near:    Bilateral Near:     Physical Exam Nursing notes and Vital Signs reviewed. Appearance:  Patient appears stated age, and in no acute distress Eyes:  Pupils are equal, round, and reactive to light and accomodation.   Extraocular movement is intact.  Conjunctivae are not inflamed  Ears:  Canals normal.  Tympanic membranes normal.  Nose:  Mildly congested turbinates.  No sinus tenderness.  Pharynx:  Normal Neck:  Supple.  No adenopathy. Lungs:   Distant breath sounds.  Breath sounds are equal.  Moving air well. Heart:  Regular rate and rhythm without murmurs, rubs, or gallops.  Abdomen:  Nontender without masses or hepatosplenomegaly.  Bowel sounds are present.  No CVA or flank tenderness.  Extremities:  No edema.  Skin:  No rash present.    UC Treatments / Results  Labs (all labs ordered are listed, but only abnormal results are displayed) Labs Reviewed - No data to display  EKG None  Radiology Dg Chest 2 View  Result Date: 07/20/2018 CLINICAL DATA:  62 year old male with persistent cough since being diagnosed with influenza on October 22nd. EXAM: CHEST - 2 VIEW COMPARISON:  None. FINDINGS: The lungs are clear and negative for focal airspace consolidation, pulmonary edema or suspicious pulmonary nodule. No pleural effusion or pneumothorax. Cardiac and mediastinal contours are within normal limits. No acute fracture or lytic or blastic osseous lesions. The visualized upper abdominal bowel gas pattern is unremarkable. IMPRESSION: No active cardiopulmonary disease. Electronically Signed   By: Malachy Moan M.D.   On: 07/20/2018 18:00    Procedures Procedures (including critical care time)  Medications Ordered in UC Medications  methylPREDNISolone sodium succinate (SOLU-MEDROL) 125 mg/2 mL injection 80 mg (has no administration in time range)    Initial Impression / Assessment and Plan / UC Course  I have reviewed the triage vital signs and the nursing notes.  Pertinent labs & imaging results that were available during my care of the patient were reviewed by me and considered in my medical decision making (see chart for details).     Suspect post-infectious cough but will begin empiric  doxycycline. Administered Solumedrol 125mg  IM, then begin prednisone burst/taper. Rx for Robitussin AC for night time cough.  Followup with Family Doctor if not improved in 7 to 10 days.   Final Clinical Impressions(s) / UC Diagnoses   Final diagnoses:  Persistent cough for 3 weeks or longer     Discharge Instructions     Begin prednisone Thursday 07/21/18. Take plain guaifenesin (1200mg  extended release tabs such as Mucinex) twice daily, with plenty of water, for cough and congestion.  Get adequate  rest.   Stop all antihistamines for now, and other non-prescription cough/cold preparations.        ED Prescriptions    Medication Sig Dispense Auth. Provider   predniSONE (DELTASONE) 20 MG tablet Take one tab by mouth twice daily for 4 days, then one daily for 3 days. Take with food. 11 tablet Lattie HawBeese, Lamontae Ricardo A, MD   doxycycline (VIBRAMYCIN) 100 MG capsule Take 1 capsule (100 mg total) by mouth 2 (two) times daily. Take with food. 14 capsule Lattie HawBeese, Alanee Ting A, MD   guaiFENesin-codeine 100-10 MG/5ML syrup Take 10mL by mouth at bedtime as needed for cough.  May repeat dose in 4 to 6 hours. 100 mL Lattie HawBeese, Jacquese Hackman A, MD   albuterol (PROVENTIL HFA;VENTOLIN HFA) 108 (90 Base) MCG/ACT inhaler Inhale 2 puffs into the lungs every 4 (four) hours as needed for wheezing or shortness of breath. 1 Inhaler Lattie HawBeese, Velma Hanna A, MD        Lattie HawBeese, Dejai Schubach A, MD 07/23/18 [phone]

## 2018-07-20 NOTE — Discharge Instructions (Addendum)
Begin prednisone Thursday 07/21/18. Take plain guaifenesin (1200mg  extended release tabs such as Mucinex) twice daily, with plenty of water, for cough and congestion.  Get adequate rest.   Stop all antihistamines for now, and other non-prescription cough/cold preparations.

## 2018-11-07 ENCOUNTER — Other Ambulatory Visit: Payer: Self-pay

## 2018-11-07 ENCOUNTER — Emergency Department (INDEPENDENT_AMBULATORY_CARE_PROVIDER_SITE_OTHER)
Admission: EM | Admit: 2018-11-07 | Discharge: 2018-11-07 | Disposition: A | Discharge status: Home / Self Care | Payer: Managed Care, Other (non HMO) | Source: Home / Self Care

## 2018-11-07 DIAGNOSIS — J069 Acute upper respiratory infection, unspecified: Secondary | ICD-10-CM

## 2018-11-07 DIAGNOSIS — R05 Cough: Secondary | ICD-10-CM

## 2018-11-07 DIAGNOSIS — R053 Chronic cough: Secondary | ICD-10-CM

## 2018-11-07 MED ORDER — AZITHROMYCIN 250 MG PO TABS: 250.0000 mg | ORAL_TABLET | Freq: Every day | ORAL | 0 refills | Status: DC

## 2018-11-07 NOTE — ED Provider Notes (Signed)
[facility]    CSN: [id number] Arrival date & time: 11/07/18  1650     History   Chief Complaint Chief Complaint  Patient presents with  . Cough    HPI Paul Wagner is a 63 y.o. male.   HPI  Paul Wagner is a 63 y.o. male presenting to UC with c/o cough and congestion with yellow mucous for 3-4 weeks. He has tried various OTC with mild relief.  Denies chest pain or SOB. Denies known fever. No n/v/d. No known sick contacts.    Past Medical History:  Diagnosis Date  . Cerebral palsy (HCC)   . High cholesterol   . Hypertension    untreated    There are no active problems to display for this patient.   History reviewed. No pertinent surgical history.     Home Medications    Prior to Admission medications   Medication Sig Start Date End Date Taking? Authorizing Provider  albuterol (PROVENTIL HFA;VENTOLIN HFA) 108 (90 Base) MCG/ACT inhaler Inhale 2 puffs into the lungs every 4 (four) hours as needed for wheezing or shortness of breath. 07/20/18   Lattie Haw, MD  azithromycin (ZITHROMAX) 250 MG tablet Take 1 tablet (250 mg total) by mouth daily. Take first 2 tablets together, then 1 every day until finished. 11/07/18   Lurene Shadow, PA-C  doxycycline (VIBRAMYCIN) 100 MG capsule Take 1 capsule (100 mg total) by mouth 2 (two) times daily. Take with food. 07/20/18   Lattie Haw, MD  esomeprazole (NEXIUM) 40 MG capsule Take 40 mg by mouth daily at 12 noon.    [provider]  guaiFENesin-codeine 100-10 MG/5ML syrup Take 56mL by mouth at bedtime as needed for cough.  May repeat dose in 4 to 6 hours. 07/20/18   Lattie Haw, MD  metaxalone (SKELAXIN) 800 MG tablet Take 800 mg by mouth 3 (three) times daily.    [provider]  naproxen sodium (ANAPROX) 220 MG tablet Take 220 mg by mouth 2 (two) times daily with a meal.    [provider]  pravastatin (PRAVACHOL) 20 MG tablet Take 20 mg by mouth daily.    [provider]  predniSONE (DELTASONE) 20 MG tablet Take one tab by mouth twice daily for 4 days, then one daily for 3 days. Take with food. 07/20/18   Lattie Haw, MD  silodosin (RAPAFLO) 8 MG CAPS capsule Take 8 mg by mouth daily with breakfast.    [provider]    Family History History reviewed. No pertinent family history.  Social History Social History   Tobacco Use  . Smoking status: Never Smoker  . Smokeless tobacco: Never Used  Substance Use Topics  . Alcohol use: No  . Drug use: No     Allergies   Erythromycin   Review of Systems Review of Systems  Constitutional: Negative for chills and fever.  HENT: Positive for congestion. Negative for ear pain, sore throat, trouble swallowing and voice change.   Respiratory: Positive for cough. Negative for shortness of breath.   Cardiovascular: Negative for chest pain and palpitations.  Gastrointestinal: Negative for abdominal pain, diarrhea, nausea and vomiting.  Musculoskeletal: Negative for arthralgias, back pain and myalgias.  Skin: Negative for rash.     Physical Exam Triage Vital Signs ED Triage Vitals  Enc Vitals Group     BP 11/07/18 1731 (!) 128/91     Pulse Rate 11/07/18 1731 93     Resp 11/07/18 1731  20     Temp 11/07/18 1731 97.8 F (36.6 C)     Temp Source 11/07/18 1731 Oral     SpO2 11/07/18 1731 96 %     Weight 11/07/18 1734 223 lb (101.2 kg)     Height 11/07/18 1734  (1.778 m)     Head Circumference --      Peak Flow --      Pain Score 11/07/18 1733 4     Pain Loc --      Pain Edu? --      Excl. in GC? --    No data found.  Updated Vital Signs BP (!) 128/91 (BP Location: Right Arm)   Pulse 93   Temp 97.8 F (36.6 C) (Oral)   Resp 20   Ht  (1.778 m)   Wt 223 lb (101.2 kg)   SpO2 96%   BMI 32.00 kg/m   Visual Acuity Right Eye Distance:   Left Eye Distance:   Bilateral Distance:    Right Eye Near:   Left Eye Near:    Bilateral Near:     Physical Exam Vitals  signs and nursing note reviewed.  Constitutional:      Appearance: Normal appearance. He is well-developed.  HENT:     Head: Normocephalic and atraumatic.     Right Ear: Tympanic membrane normal.     Left Ear: Tympanic membrane normal.     Nose: Nose normal.     Right Sinus: No maxillary sinus tenderness or frontal sinus tenderness.     Left Sinus: No maxillary sinus tenderness or frontal sinus tenderness.     Mouth/Throat:     Lips: Pink.     Mouth: Mucous membranes are moist.     Pharynx: Oropharynx is clear. Uvula midline.  Neck:     Musculoskeletal: Normal range of motion.  Cardiovascular:     Rate and Rhythm: Normal rate and regular rhythm.  Pulmonary:     Effort: Pulmonary effort is normal. No respiratory distress.     Breath sounds: No stridor. Rhonchi (faint, clears with cough) present. No wheezing.  Musculoskeletal: Normal range of motion.  Skin:    General: Skin is warm and dry.  Neurological:     Mental Status: He is alert and oriented to person, place, and time.  Psychiatric:        Behavior: Behavior normal.      UC Treatments / Results  Labs (all labs ordered are listed, but only abnormal results are displayed) Labs Reviewed - No data to display  EKG None  Radiology No results found.  Procedures Procedures (including critical care time)  Medications Ordered in UC Medications - No data to display  Initial Impression / Assessment and Plan / UC Course  I have reviewed the triage vital signs and the nursing notes.  Pertinent labs & imaging results that were available during my care of the patient were reviewed by me and considered in my medical decision making (see chart for details).     Pt appears well, NAD. Due to duration of cough, will cover for secondary bacterial infect. Offered CXR, pt declined, he will try trial of antibiotics and f/u if not improving. Home care info provided  Final Clinical Impressions(s) / UC Diagnoses   Final  diagnoses:  Upper respiratory tract infection, unspecified type  Persistent cough     Discharge Instructions      Please take antibiotics as prescribed and be sure to complete entire course  even if you start to feel better to ensure infection does not come back.  You may take 500mg  acetaminophen every 4-6 hours or in combination with ibuprofen 400mg  every 6-8 hours as needed for pain, inflammation, and fever.  Be sure to well hydrated with clear liquids and get at least 8 hours of sleep at night, preferably more while sick.   Please follow up with family medicine in 1 week if needed.     ED Prescriptions    Medication Sig Dispense Auth. Provider   azithromycin (ZITHROMAX) 250 MG tablet Take 1 tablet (250 mg total) by mouth daily. Take first 2 tablets together, then 1 every day until finished. 6 tablet Lurene Shadow, PA-C     Controlled Substance Prescriptions [place] Controlled Substance Registry consulted? Not Applicable   Rolla Plate 11/07/18 2008

## 2018-11-07 NOTE — Discharge Instructions (Signed)
°  Please take antibiotics as prescribed and be sure to complete entire course even if you start to feel better to ensure infection does not come back. ° °You may take 500mg acetaminophen every 4-6 hours or in combination with ibuprofen 400mg every 6-8 hours as needed for pain, inflammation, and fever. ° °Be sure to well hydrated with clear liquids and get at least 8 hours of sleep at night, preferably more while sick.  ° °Please follow up with family medicine in 1 week if needed. ° ° °

## 2018-11-07 NOTE — ED Triage Notes (Signed)
Sx going for 3-4 weeks, nasal congestion, yellow mucous, cough.  Denies fever.

## 2018-11-16 ENCOUNTER — Encounter: Payer: Self-pay | Admitting: Emergency Medicine

## 2018-11-16 ENCOUNTER — Emergency Department (INDEPENDENT_AMBULATORY_CARE_PROVIDER_SITE_OTHER)
Admission: EM | Admit: 2018-11-16 | Discharge: 2018-11-16 | Disposition: A | Discharge status: Home / Self Care | Payer: Managed Care, Other (non HMO) | Source: Home / Self Care

## 2018-11-16 ENCOUNTER — Other Ambulatory Visit: Payer: Self-pay

## 2018-11-16 DIAGNOSIS — L03031 Cellulitis of right toe: Secondary | ICD-10-CM

## 2018-11-16 DIAGNOSIS — L03115 Cellulitis of right lower limb: Secondary | ICD-10-CM

## 2018-11-16 MED ORDER — CEFTRIAXONE SODIUM 1 G IJ SOLR
1.0000 g | Freq: Once | INTRAMUSCULAR | Status: AC
  Administered 2018-11-16: 1 g via INTRAMUSCULAR

## 2018-11-16 MED ORDER — CLINDAMYCIN HCL 300 MG PO CAPS: 300.0000 mg | ORAL_CAPSULE | Freq: Four times a day (QID) | ORAL | 0 refills | Status: DC

## 2018-11-16 NOTE — Discharge Instructions (Signed)
°  Please take your antibiotics as prescribed.  Please see additional information in this packet for skin infections and how you can help treat at home.  Follow up in 3-4 days if not improving, sooner if worsening.  Call 911 or go to the hospital if symptoms significantly worsening- pain, swelling, red streaking up your leg, chest pain or trouble breathing.

## 2018-11-16 NOTE — ED Provider Notes (Signed)
[facility]    CSN: [id number] Arrival date & time: 11/16/18  1916     History   Chief Complaint Chief Complaint  Patient presents with  . Foot Swelling    HPI Paul Wagner is a 63 y.o. male.   HPI Paul Wagner is a 63 y.o. male presenting to UC with c/o Right foot swelling and pain that started earlier tonight. Pain is mildly aching and sore.  Hx of cellulitis in the same foot/leg several months ago. Denies hx of diabetes. Denies hospitalization from the cellulitis. He was tx with clindamycin and the symptoms resolved completely. Denies fever, chills, n/v/d, SOB or chest pain. No leg pain or swelling. No red streaking.    Past Medical History:  Diagnosis Date  . Cerebral palsy (HCC)   . High cholesterol   . Hypertension    untreated    There are no active problems to display for this patient.   History reviewed. No pertinent surgical history.     Home Medications    Prior to Admission medications   Medication Sig Start Date End Date Taking? Authorizing Provider  albuterol (PROVENTIL HFA;VENTOLIN HFA) 108 (90 Base) MCG/ACT inhaler Inhale 2 puffs into the lungs every 4 (four) hours as needed for wheezing or shortness of breath. 07/20/18   Lattie Haw, MD  clindamycin (CLEOCIN) 300 MG capsule Take 1 capsule (300 mg total) by mouth 4 (four) times daily. X 7 days 11/16/18   Lurene Shadow, PA-C  esomeprazole (NEXIUM) 40 MG capsule Take 40 mg by mouth daily at 12 noon.    [provider]  metaxalone (SKELAXIN) 800 MG tablet Take 800 mg by mouth 3 (three) times daily.    [provider]  naproxen sodium (ANAPROX) 220 MG tablet Take 220 mg by mouth 2 (two) times daily with a meal.    [provider]  pravastatin (PRAVACHOL) 20 MG tablet Take 20 mg by mouth daily.    [provider]  silodosin (RAPAFLO) 8 MG CAPS capsule Take 8 mg by mouth daily with breakfast.    [provider]    Family History History  reviewed. No pertinent family history.  Social History Social History   Tobacco Use  . Smoking status: Never Smoker  . Smokeless tobacco: Never Used  Substance Use Topics  . Alcohol use: No  . Drug use: No     Allergies   Erythromycin   Review of Systems Review of Systems  Constitutional: Negative for chills and fever.  Musculoskeletal: Positive for arthralgias and joint swelling. Negative for gait problem and myalgias.  Skin: Positive for color change. Negative for wound.     Physical Exam Triage Vital Signs ED Triage Vitals  Enc Vitals Group     BP 11/16/18 1952 129/86     Pulse Rate 11/16/18 1952 (!) 101     Resp --      Temp 11/16/18 1952 98.4 F (36.9 C)     Temp Source 11/16/18 1952 Oral     SpO2 11/16/18 1952 97 %     Weight 11/16/18 1953 224 lb (101.6 kg)     Height 11/16/18 1953 5\' 10"  (1.778 m)     Head Circumference --      Peak Flow --      Pain Score 11/16/18 1952 5     Pain Loc --      Pain Edu? --      Excl. in GC? --    No  data found.  Updated Vital Signs BP 129/86 (BP Location: Right Arm)   Pulse (!) 101   Temp 98.4 F (36.9 C) (Oral)   Ht 5\' 10"  (1.778 m)   Wt 224 lb (101.6 kg)   SpO2 97%   BMI 32.14 kg/m   Visual Acuity Right Eye Distance:   Left Eye Distance:   Bilateral Distance:    Right Eye Near:   Left Eye Near:    Bilateral Near:     Physical Exam Vitals signs and nursing note reviewed.  Constitutional:      Appearance: Normal appearance. He is well-developed.  HENT:     Head: Normocephalic and atraumatic.  Neck:     Musculoskeletal: Normal range of motion.  Cardiovascular:     Rate and Rhythm: Normal rate.     Pulses:          Dorsalis pedis pulses are 2+ on the right side.       Posterior tibial pulses are 2+ on the right side.  Pulmonary:     Effort: Pulmonary effort is normal.  Musculoskeletal: Normal range of motion.        General: Swelling and tenderness present.     Comments: Right foot: moderate  edema, mild tenderness of toes and foot. Full ROM. Calf is soft, non-tender  Skin:    General: Skin is warm and dry.     Capillary Refill: Capillary refill takes less than 2 seconds.     Comments: Right foot: thickened yellow nails. Toes area erythematous and warm. Mild tenderness. No red streaking.   Neurological:     Mental Status: He is alert and oriented to person, place, and time.  Psychiatric:        Behavior: Behavior normal.      UC Treatments / Results  Labs (all labs ordered are listed, but only abnormal results are displayed) Labs Reviewed - No data to display  EKG None  Radiology No results found.  Procedures Procedures (including critical care time)  Medications Ordered in UC Medications  cefTRIAXone (ROCEPHIN) injection 1 g (1 g Intramuscular Given 11/16/18 2030)    Initial Impression / Assessment and Plan / UC Course  I have reviewed the triage vital signs and the nursing notes.  Pertinent labs & imaging results that were available during my care of the patient were reviewed by me and considered in my medical decision making (see chart for details).     Hx and exam c/w cellulitis  Rocephin 1g IM given in UC Will start pt on clindamycin   Final Clinical Impressions(s) / UC Diagnoses   Final diagnoses:  Cellulitis of right foot     Discharge Instructions      Please take your antibiotics as prescribed.  Please see additional information in this packet for skin infections and how you can help treat at home.  Follow up in 3-4 days if not improving, sooner if worsening.  Call 911 or go to the hospital if symptoms significantly worsening- pain, swelling, red streaking up your leg, chest pain or trouble breathing.     ED Prescriptions    Medication Sig Dispense Auth. Provider   clindamycin (CLEOCIN) 300 MG capsule Take 1 capsule (300 mg total) by mouth 4 (four) times daily. X 7 days 28 capsule Lurene Shadow, [place]     Controlled Substance  Prescriptions [place] Controlled Substance Registry consulted? Not Applicable   Rolla Plate 11/17/18 1517

## 2018-11-16 NOTE — ED Triage Notes (Signed)
RT foot swelling, started tonight. HX cellulitis

## 2018-11-19 ENCOUNTER — Telehealth: Payer: Self-pay

## 2018-11-19 NOTE — Telephone Encounter (Signed)
PT says foot is filling much better after UC visit. Advised to call with any questions.

## 2019-08-05 ENCOUNTER — Emergency Department (INDEPENDENT_AMBULATORY_CARE_PROVIDER_SITE_OTHER)
Admission: EM | Admit: 2019-08-05 | Discharge: 2019-08-05 | Disposition: A | Discharge status: Home / Self Care | Payer: Managed Care, Other (non HMO) | Source: Home / Self Care

## 2019-08-05 ENCOUNTER — Encounter: Payer: Self-pay | Admitting: Emergency Medicine

## 2019-08-05 ENCOUNTER — Other Ambulatory Visit: Payer: Self-pay

## 2019-08-05 DIAGNOSIS — J0100 Acute maxillary sinusitis, unspecified: Secondary | ICD-10-CM | POA: Diagnosis not present

## 2019-08-05 MED ORDER — LORATADINE 10 MG PO TABS: 10.0000 mg | ORAL_TABLET | Freq: Every day | ORAL | 0 refills | Status: AC

## 2019-08-05 MED ORDER — MOMETASONE FUROATE 50 MCG/ACT NA SUSP: 2.0000 | Freq: Every day | NASAL | 12 refills | Status: AC

## 2019-08-05 NOTE — ED Triage Notes (Signed)
Pt feels like he has a sinus infection, pain under eyes and above eyes, low grade fever

## 2019-08-05 NOTE — ED Provider Notes (Signed)
[facility]    CSN: [id number] Arrival date & time: 08/05/19  1352      History   Chief Complaint Chief Complaint  Patient presents with  . Facial Pain    HPI Paul Wagner is a 63 y.o. male with history of hypertension presenting for sinus pressure since this morning.  Patient endorsing history of seasonal sinusitis, for which he usually gets an antibiotic.  Chart review done by me at time appointment showing history of viral, seasonal, and bacterial sinusitis.  Patient has not tried anything for symptoms.  Patient does use CPAP with humidifier.  No change in medications, sick exposures, fever.  Patient states he presented today as he "knows his body "and was hoping to get an antibiotic to "get ahead of it".   Past Medical History:  Diagnosis Date  . Cerebral palsy (HCC)   . High cholesterol   . Hypertension    untreated    There are no active problems to display for this patient.   History reviewed. No pertinent surgical history.     Home Medications    Prior to Admission medications   Medication Sig Start Date End Date Taking? Authorizing Provider  albuterol (PROVENTIL HFA;VENTOLIN HFA) 108 (90 Base) MCG/ACT inhaler Inhale 2 puffs into the lungs every 4 (four) hours as needed for wheezing or shortness of breath. 07/20/18   Lattie HawBeese, Stephen A, MD  esomeprazole (NEXIUM) 40 MG capsule Take 40 mg by mouth daily at 12 noon.    [provider]  loratadine (CLARITIN) 10 MG tablet Take 1 tablet (10 mg total) by mouth daily. 08/05/19   Hall-Potvin, [place], PA-C  metaxalone (SKELAXIN) 800 MG tablet Take 800 mg by mouth 3 (three) times daily.    [provider]  mometasone (NASONEX) 50 MCG/ACT nasal spray Place 2 sprays into the nose daily. 08/05/19   Hall-Potvin, [place], PA-C  naproxen sodium (ANAPROX) 220 MG tablet Take 220 mg by mouth 2 (two) times daily with a meal.    [provider]  pravastatin (PRAVACHOL) 20 MG tablet Take 20 mg  by mouth daily.    [provider]  silodosin (RAPAFLO) 8 MG CAPS capsule Take 8 mg by mouth daily with breakfast.    [provider]    Family History No family history on file.  Social History Social History   Tobacco Use  . Smoking status: Never Smoker  . Smokeless tobacco: Never Used  Substance Use Topics  . Alcohol use: No  . Drug use: No     Allergies   Erythromycin   Review of Systems Review of Systems  Constitutional: Negative for fatigue and fever.  HENT: Positive for sinus pressure and sinus pain. Negative for congestion, dental problem, ear pain, facial swelling, hearing loss, sore throat, trouble swallowing and voice change.   Eyes: Negative for photophobia, pain and visual disturbance.  Respiratory: Negative for cough and shortness of breath.   Cardiovascular: Negative for chest pain and palpitations.  Gastrointestinal: Negative for diarrhea and vomiting.  Musculoskeletal: Negative for arthralgias and myalgias.  Neurological: Negative for dizziness and headaches.     Physical Exam Triage Vital Signs ED Triage Vitals  Enc Vitals Group     BP 08/05/19 1507 131/88     Pulse Rate 08/05/19 1507 99     Resp --      Temp 08/05/19 1507 99.2 F (37.3 C)     Temp Source 08/05/19 1507 Oral     SpO2 08/05/19 1507 97 %  Weight 08/05/19 1509 209 lb (94.8 kg)     Height 08/05/19 1509 5\' 10"  (1.778 m)     Head Circumference --      Peak Flow --      Pain Score 08/05/19 1509 4     Pain Loc --      Pain Edu? --      Excl. in GC? --    No data found.  Updated Vital Signs BP 131/88 (BP Location: Left Arm)   Pulse 99   Temp 99.2 F (37.3 C) (Oral)   Ht 5\' 10"  (1.778 m)   Wt 209 lb (94.8 kg)   SpO2 97%   BMI 29.99 kg/m   Visual Acuity Right Eye Distance:   Left Eye Distance:   Bilateral Distance:    Right Eye Near:   Left Eye Near:    Bilateral Near:     Physical Exam Constitutional:      General: He is not in acute distress.     Appearance: He is obese. He is not ill-appearing.  HENT:     Head: Normocephalic and atraumatic.     Right Ear: Tympanic membrane, ear canal and external ear normal.     Left Ear: Tympanic membrane, ear canal and external ear normal.     Nose: No nasal deformity, congestion or rhinorrhea.     Comments: Turbinates nonedematous bilaterally with pink mucosa.  Mild maxillary sinus tenderness bilaterally.    Mouth/Throat:     Mouth: Mucous membranes are moist.     Tongue: Tongue does not deviate from midline.     Pharynx: Oropharynx is clear. Uvula midline.     Comments: No tonsillar hypertrophy or exudate Eyes:     General: No scleral icterus.    Conjunctiva/sclera: Conjunctivae normal.     Pupils: Pupils are equal, round, and reactive to light.  Neck:     Musculoskeletal: Normal range of motion and neck supple. No muscular tenderness.  Cardiovascular:     Rate and Rhythm: Normal rate.  Pulmonary:     Effort: Pulmonary effort is normal. No respiratory distress.     Breath sounds: No wheezing.  Lymphadenopathy:     Cervical: No cervical adenopathy.  Neurological:     Mental Status: He is alert.      UC Treatments / Results  Labs (all labs ordered are listed, but only abnormal results are displayed) Labs Reviewed - No data to display  EKG   Radiology No results found.  Procedures Procedures (including critical care time)  Medications Ordered in UC Medications - No data to display  Initial Impression / Assessment and Plan / UC Course  I have reviewed the triage vital signs and the nursing notes.  Pertinent labs & imaging results that were available during my care of the patient were reviewed by me and considered in my medical decision making (see chart for details).     H&P consistent with maxillary sinusitis: This provider spent great time discussing clinical guidelines of empiric antibiotic treatment for ABS.  We will try symptomatic management first, have  patient return for possible antibiotic after 7 days, or second worsening.  Return precautions discussed, patient verbalized understanding and is agreeable to plan. Final Clinical Impressions(s) / UC Diagnoses   Final diagnoses:  Acute maxillary sinusitis, recurrence not specified     Discharge Instructions     Medications as prescribed. Important to reintroduce moisture into the ear: This can be done with warm showers, boiling pot of water,  using Vicks vapor rub to help open sinus passages. Recommend Tylenol for additional pain relief. Return for worsening pain, fevers, nasal congestion, ear pain.    ED Prescriptions    Medication Sig Dispense Auth. Provider   mometasone (NASONEX) 50 MCG/ACT nasal spray Place 2 sprays into the nose daily. 17 g Hall-Potvin, [place], PA-C   loratadine (CLARITIN) 10 MG tablet Take 1 tablet (10 mg total) by mouth daily. 30 tablet Hall-Potvin, [place], PA-C     PDMP not reviewed this encounter.   Neldon Mc [place], [place] 08/07/19 [phone]

## 2019-08-05 NOTE — Discharge Instructions (Signed)
Medications as prescribed. Important to reintroduce moisture into the ear: This can be done with warm showers, boiling pot of water, using Vicks vapor rub to help open sinus passages. Recommend Tylenol for additional pain relief. Return for worsening pain, fevers, nasal congestion, ear pain.

## 2019-08-07 ENCOUNTER — Encounter: Payer: Self-pay | Admitting: Emergency Medicine

## 2019-08-09 ENCOUNTER — Telehealth: Payer: Self-pay

## 2019-08-09 ENCOUNTER — Encounter (INDEPENDENT_AMBULATORY_CARE_PROVIDER_SITE_OTHER): Payer: Self-pay

## 2019-08-09 ENCOUNTER — Other Ambulatory Visit: Payer: Self-pay

## 2019-08-09 ENCOUNTER — Telehealth: Payer: Managed Care, Other (non HMO) | Admitting: Family

## 2019-08-09 ENCOUNTER — Telehealth: Payer: Self-pay | Admitting: *Deleted

## 2019-08-09 DIAGNOSIS — Z20822 Contact with and (suspected) exposure to covid-19: Secondary | ICD-10-CM

## 2019-08-09 DIAGNOSIS — Z20828 Contact with and (suspected) exposure to other viral communicable diseases: Secondary | ICD-10-CM

## 2019-08-09 MED ORDER — BENZONATATE 100 MG PO CAPS: 100.0000 mg | ORAL_CAPSULE | Freq: Three times a day (TID) | ORAL | 0 refills | Status: DC | PRN

## 2019-08-09 MED ORDER — AMOXICILLIN-POT CLAVULANATE 875-125 MG PO TABS: 1.0000 | ORAL_TABLET | Freq: Two times a day (BID) | ORAL | 0 refills | Status: DC

## 2019-08-09 NOTE — Telephone Encounter (Signed)
Pt's wife called reports that he now has the same symptoms as he did at his visit on  08/05/19, additionally today he has a fever of 102 and a cough with clear phlegm. Requesting  ABT. [facility] [address] [employer].

## 2019-08-09 NOTE — Telephone Encounter (Signed)
Patient advised on temperature per protocol:   Temperature is the same and is noted to be less than 100.4: Continue to monitor at home.   Advise patient to stay hydrated, push oral fluids if able, and advise pt. to avoid using multiple blankets and layer of clothing to prevent overheating.   Avoid over use of anti-pyretic medications for fevers less than 101 degrees Fahrenheit.   Fever helps fight infection.    Worsening temperature, treat if temperature is > 101: treat with OTC anti-fever medications (Tylenol and/or Ibuprofen)   May alternate Tylenol and Ibuprofen every 3 hours as needed for fever greater than 101. Example: Tylenol at 9AM, Ibuprofen at 12PM, Tylenol at 3PM, Ibuprofen at 6PM, Tylenol at 9PM, Ibuprofen at 12AM, Tylenol at 3AM, Ibuprofen at 6AM. Then restart rotation with Tylenol at 9AM.   If fever remains for greater than 3 days, notify PCP.   If fever becomes greater than 103 and unable to reduce with over the counter medication, contact PCP.  Patient states that he had a fever but it has returned to normal temperature.   Patient verbalized understanding and agrees with plan

## 2019-08-09 NOTE — Progress Notes (Signed)
E-Visit for Corona Virus Screening   Your current symptoms could be consistent with the coronavirus.  Many health care providers can now test patients at their office but not all are.  [facility] has multiple testing sites. For information on our COVID testing locations and hours go to [email or web address]  Please quarantine yourself while awaiting your test results.  We are enrolling you in our MyChart Home [facility] for COVID19 . Daily you will receive a questionnaire within the MyChart website. Our COVID 19 response team willl be monitoriing your responses daily. Please continue good preventive care measures, including:  frequent hand-washing, avoid touching your face, cover coughs/sneezes, stay out of crowds and keep a 6 foot distance from others.   You can go to one of the testing sites listed below, while they are opened (see hours). You do not need an order and will stay in your car during the test. You do need to self isolate until your results return and if positive 14 days from when your symptoms started and until you are 3 days fever free.   Testing Locations (Monday - Friday, 10 a.m. - 3 p.m.) . [place]: [facility], [address], [place], [place]  . [place]: [facility], [address], [place], [place] (entrance off [address])  . [place]: (Closed each Monday): Testing site relocated to the short stay covered drive at [facility]. (Use the [address] entrance to [facility] next to [facility].)    COVID-19 is a respiratory illness with symptoms that are similar to the flu. Symptoms are typically mild to moderate, but there have been cases of severe illness and death due to the virus. The following symptoms may appear 2-14 days after exposure: . Fever . Cough . Shortness of breath or difficulty breathing . Chills . Repeated shaking with chills . Muscle  pain . Headache . Sore throat . New loss of taste or smell . Fatigue . Congestion or runny nose . Nausea or vomiting . Diarrhea  If you develop fever/cough/breathlessness, please stay home for 10 days with improving symptoms and until you have had 24 hours of no fever (without taking a fever reducer).  Go to the nearest hospital ED for assessment if fever/cough/breathlessness are severe or illness seems like a threat to life.  It is vitally important that if you feel that you have an infection such as this virus or any other virus that you stay home and away from places where you may spread it to others.  You should avoid contact with people age 65 and older.   You should wear a mask or cloth face covering over your nose and mouth if you must be around other people or animals, including pets (even at home). Try to stay at least 6 feet away from other people. This will protect the people around you.  You can use medication such as A prescription cough medication called Tessalon Perles 100 mg. You may take 1-2 capsules every 8 hours as needed for cough.  You may also take acetaminophen (Tylenol) as needed for fever.   Reduce your risk of any infection by using the same precautions used for avoiding the common cold or flu:  . Wash your hands often with soap and warm water for at least 20 seconds.  If soap and water are not readily available, use an alcohol-based hand sanitizer with at least 60% alcohol.  . If coughing or   sneezing, cover your mouth and nose by coughing or sneezing into the elbow areas of your shirt or coat, into a tissue or into your sleeve (not your hands). . Avoid shaking hands with others and consider head nods or verbal greetings only. . Avoid touching your eyes, nose, or mouth with unwashed hands.  . Avoid close contact with people who are sick. . Avoid places or events with large numbers of people in one location, like concerts or sporting events. . Carefully consider  travel plans you have or are making. . If you are planning any travel outside or inside the [place], visit the CDC's Travelers' Health webpage for the latest health notices. . If you have some symptoms but not all symptoms, continue to monitor at home and seek medical attention if your symptoms worsen. . If you are having a medical emergency, call 911.  HOME CARE . Only take medications as instructed by your medical team. . Drink plenty of fluids and get plenty of rest. . A steam or ultrasonic humidifier can help if you have congestion.   GET HELP RIGHT AWAY IF YOU HAVE EMERGENCY WARNING SIGNS** FOR COVID-19. If you or someone is showing any of these signs seek emergency medical care immediately. Call 911 or proceed to your closest emergency facility if: . You develop worsening high fever. . Trouble breathing . Bluish lips or face . Persistent pain or pressure in the chest . New confusion . Inability to wake or stay awake . You cough up blood. . Your symptoms become more severe  **This list is not all possible symptoms. Contact your medical provider for any symptoms that are sever or concerning to you.   MAKE SURE YOU   Understand these instructions.  Will watch your condition.  Will get help right away if you are not doing well or get worse.  Your e-visit answers were reviewed by a board certified advanced clinical practitioner to complete your personal care plan.  Depending on the condition, your plan could have included both over the counter or prescription medications.  If there is a problem please reply once you have received a response from your provider.  Your safety is important to us.  If you have drug allergies check your prescription carefully.    You can use MyChart to ask questions about today's visit, request a non-urgent call back, or ask for a work or school excuse for 24 hours related to this e-Visit. If it has been greater than 24 hours you will need to follow up with  your provider, or enter a new e-Visit to address those concerns. You will get an e-mail in the next two days asking about your experience.  I hope that your e-visit has been valuable and will speed your recovery. Thank you for using e-visits.  Approximately 5 minutes was spent documenting and reviewing patient's chart.    

## 2019-08-09 NOTE — Telephone Encounter (Signed)
Per Alyse Low, PA ok to send ABT to pharmacy, but the pt needs to do an e-visit and go to a drive thru center for a COVID test today. Pt verbalized understanding.

## 2019-08-10 ENCOUNTER — Encounter (INDEPENDENT_AMBULATORY_CARE_PROVIDER_SITE_OTHER): Payer: Self-pay

## 2019-08-10 ENCOUNTER — Telehealth: Payer: Self-pay

## 2019-08-10 NOTE — Telephone Encounter (Signed)
Patient advised on cough, temperature, and change in appetite per protocol:   Temperature is the same and is noted to be less than 100.4: Continue to monitor at home.   Advise patient to stay hydrated, push oral fluids if able, and advise pt. to avoid using multiple blankets and layer of clothing to prevent overheating.   Avoid over use of anti-pyretic medications for fevers less than 101 degrees Fahrenheit.   Fever helps fight infection.    Worsening temperature, treat if temperature is > 101: treat with OTC anti-fever medications (Tylenol and/or Ibuprofen)   May alternate Tylenol and Ibuprofen every 3 hours as needed for fever greater than 101. Example: Tylenol at 9AM, Ibuprofen at 12PM, Tylenol at 3PM, Ibuprofen at 6PM, Tylenol at 9PM, Ibuprofen at 12AM, Tylenol at 3AM, Ibuprofen at 6AM. Then restart rotation with Tylenol at 9AM.   If fever remains for greater than 3 days, notify PCP.   If fever becomes greater than 103 and unable to reduce with over the counter medication, contact PCP.   If cough remains the same or better: continue to treat with over the counter medications. Hard candy or cough drops and drinking warm fluids. Adults can also use honey 2 tsp (10 ML) at bedtime.   HONEY IS NOT RECOMMENDED FOR INFANTS UNDER ONE.   If cough is becoming worse even with the use of over the counter medications and patient is not able to sleep at night, cough becomes productive with sputum that maybe yellow or green in color, contact PCP.   If appetite becomes worse: encourage patient to drink fluids as tolerated, work their way up to bland solid food such as crackers, pretzels, soup, bread or applesauce and boiled starches.   If patient is unable to tolerate any foods or liquids, notify PCP.   IF PATIENT DEVELOPS SEVERE VOMITING (MORE THAN 6 TIMES A DAY AND OR >8 HOURS) AND/OR SEVERE ABDOMINAL PAIN ADVISE PATIENT TO CALL 911 AND SEEK TREATMENT IN ED.  Patient states he  vomited 3 times and that he is to keep any foods down. Patient has not taking any tylenol because he can't keep it down. Patient will contact his pcp to see if something can be sent in for the vomiting. Patient will try to take some medication to get his fever down. Patient verbalized understanding and agrees with plan.

## 2019-08-11 ENCOUNTER — Telehealth: Payer: Self-pay | Admitting: *Deleted

## 2019-08-11 ENCOUNTER — Telehealth: Payer: Self-pay

## 2019-08-11 ENCOUNTER — Encounter (INDEPENDENT_AMBULATORY_CARE_PROVIDER_SITE_OTHER): Payer: Self-pay

## 2019-08-11 LAB — NOVEL CORONAVIRUS, NAA: SARS-CoV-2, NAA: DETECTED — AB

## 2019-08-11 NOTE — Telephone Encounter (Signed)
Pt called to confirm he was reading his COVID results correctly. I informed him he has COVID and needs to quarantine for at least 10 days. His family living with him also needs to quarantine for 10 days. If his symptoms worsen or are not improving he should proceed to the ED for further evaluation. Pt verbalized understanding.

## 2019-08-11 NOTE — Telephone Encounter (Signed)
Patient advise on sob and temperature per protocol:   Shortness of breath is the same: continue to monitor at home   If symptoms become severe, i.e. shortness of breath at rest, gasping for air, wheezing, CALL 911 AND SEEK TREATMENT IN THE ED    Temperature is the same and is noted to be less than 100.4: Continue to monitor at home.   Advise patient to stay hydrated, push oral fluids if able, and advise pt. to avoid using multiple blankets and layer of clothing to prevent overheating.   Avoid over use of anti-pyretic medications for fevers less than 101 degrees Fahrenheit.   Fever helps fight infection.   Worsening temperature, treat if temperature is > 101: treat with OTC anti-fever medications (Tylenol and/or Ibuprofen)   May alternate Tylenol and Ibuprofen every 3 hours as needed for fever greater than 101. Example: Tylenol at 9AM, Ibuprofen at 12PM, Tylenol at 3PM, Ibuprofen at 6PM, Tylenol at 9PM, Ibuprofen at 12AM, Tylenol at 3AM, Ibuprofen at 6AM. Then restart rotation with Tylenol at 9AM.   If fever remains for greater than 3 days, notify PCP.   If fever becomes greater than 103 and unable to reduce with over the counter medication, contact PCP.   Patient states that he does have a fever today but had one last night. Patient states that he is having quick breathing. Patient will continue to monitor and follow guidelines if it becomes severe

## 2019-08-12 ENCOUNTER — Encounter (INDEPENDENT_AMBULATORY_CARE_PROVIDER_SITE_OTHER): Payer: Self-pay

## 2019-08-13 ENCOUNTER — Encounter (INDEPENDENT_AMBULATORY_CARE_PROVIDER_SITE_OTHER): Payer: Self-pay

## 2019-08-14 ENCOUNTER — Encounter (INDEPENDENT_AMBULATORY_CARE_PROVIDER_SITE_OTHER): Payer: Self-pay

## 2019-08-14 ENCOUNTER — Telehealth: Payer: Self-pay

## 2019-08-14 NOTE — Telephone Encounter (Signed)
Patient advise that on fever per protocol:   Temperature is the same and is noted to be less than 100.4: Continue to monitor at home.   Advise patient to stay hydrated, push oral fluids if able, and advise pt. to avoid using multiple blankets and layer of clothing to prevent overheating.   Avoid over use of anti-pyretic medications for fevers less than 101 degrees Fahrenheit.   Fever helps fight infection.   Worsening temperature, treat if temperature is > 101: treat with OTC anti-fever medications (Tylenol and/or Ibuprofen)   May alternate Tylenol and Ibuprofen every 3 hours as needed for fever greater than 101. Example: Tylenol at 9AM, Ibuprofen at 12PM, Tylenol at 3PM, Ibuprofen at 6PM, Tylenol at 9PM, Ibuprofen at 12AM, Tylenol at 3AM, Ibuprofen at 6AM. Then restart rotation with Tylenol at 9AM.   If fever remains for greater than 3 days, notify PCP.   If fever becomes greater than 103 and unable to reduce with over the counter medication, contact PCP.  Patient states his temperature was 101 yesterday and today it 99.2. Patient will contact his pcp as symptoms are not getting better

## 2019-08-15 ENCOUNTER — Encounter (INDEPENDENT_AMBULATORY_CARE_PROVIDER_SITE_OTHER): Payer: Self-pay

## 2019-08-16 ENCOUNTER — Encounter (INDEPENDENT_AMBULATORY_CARE_PROVIDER_SITE_OTHER): Payer: Self-pay

## 2019-08-17 ENCOUNTER — Encounter (INDEPENDENT_AMBULATORY_CARE_PROVIDER_SITE_OTHER): Payer: Self-pay

## 2019-08-18 ENCOUNTER — Encounter (INDEPENDENT_AMBULATORY_CARE_PROVIDER_SITE_OTHER): Payer: Self-pay

## 2019-08-19 ENCOUNTER — Encounter (INDEPENDENT_AMBULATORY_CARE_PROVIDER_SITE_OTHER): Payer: Self-pay

## 2019-08-20 ENCOUNTER — Encounter (INDEPENDENT_AMBULATORY_CARE_PROVIDER_SITE_OTHER): Payer: Self-pay

## 2019-08-21 ENCOUNTER — Encounter (INDEPENDENT_AMBULATORY_CARE_PROVIDER_SITE_OTHER): Payer: Self-pay

## 2020-04-03 IMAGING — DX DG CHEST 2V
2 series · 2 of 2 positions shown · non-contrast
Comparison: None.

CLINICAL DATA: 61-year-old male with persistent cough since being
diagnosed with influenza on [REDACTED].

EXAM:
CHEST - 2 VIEW

[chest pa]
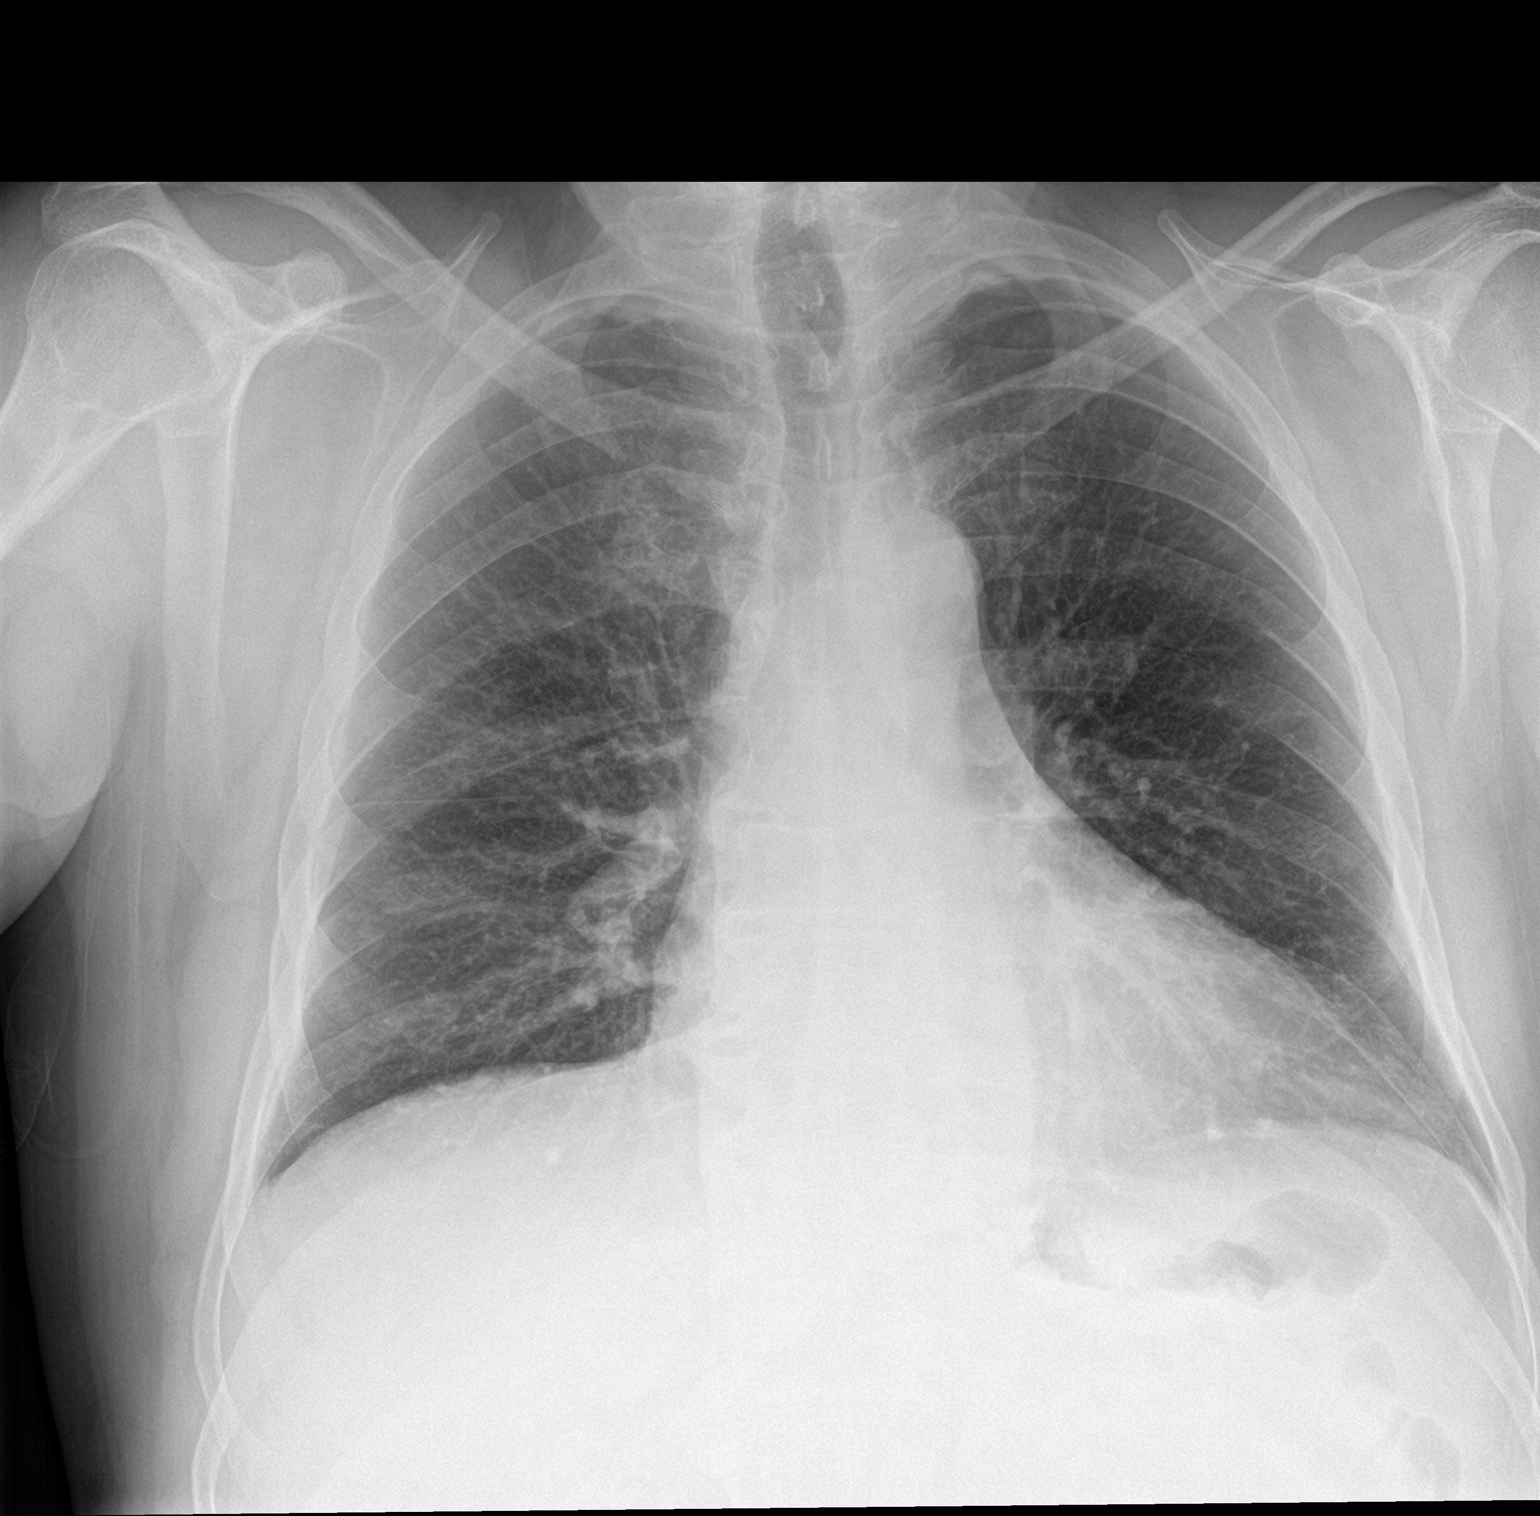

[chest lat]
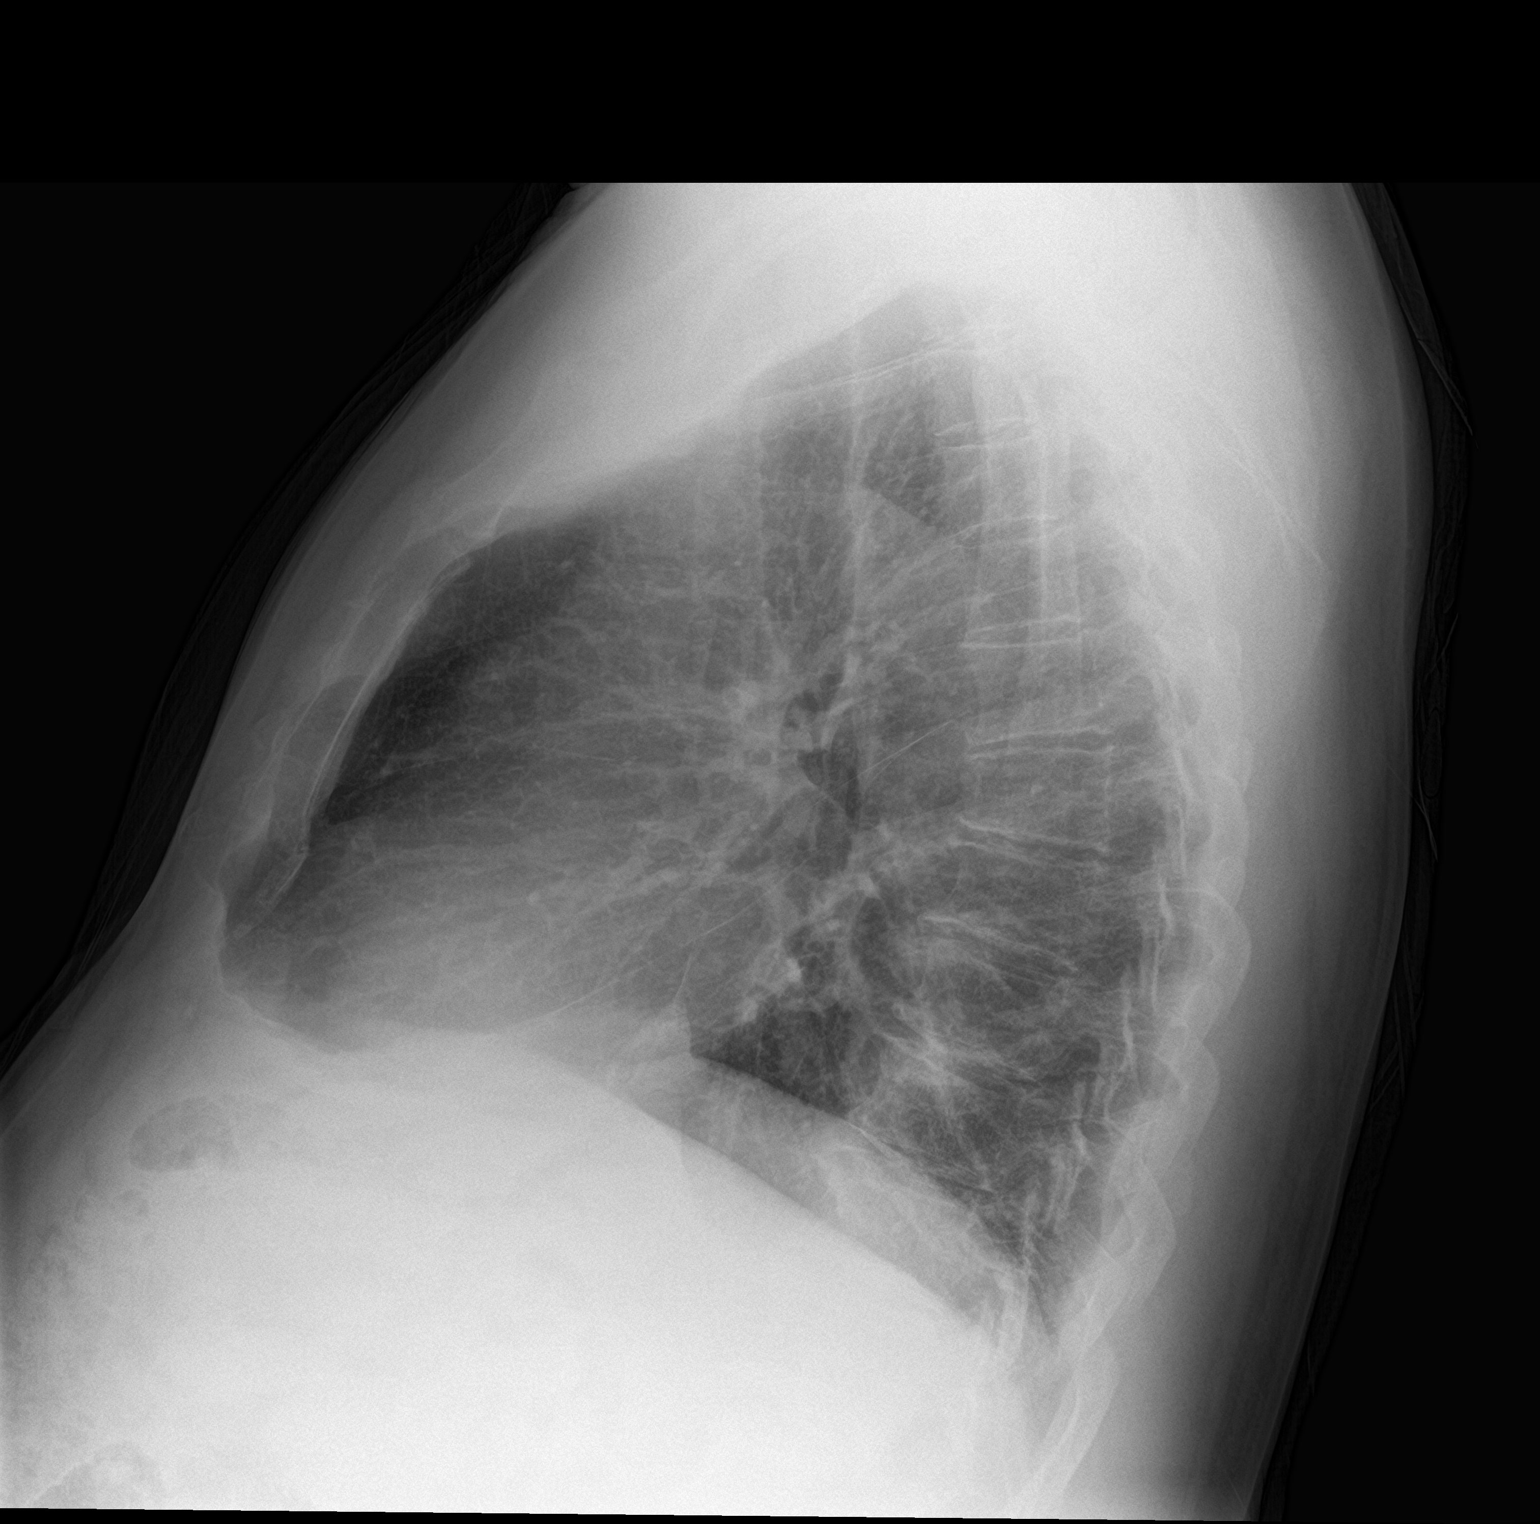

[2 of 2 positions shown; findings below may reference images not displayed]

FINDINGS: The lungs are clear and negative for focal airspace consolidation,
pulmonary edema or suspicious pulmonary nodule. No pleural effusion
or pneumothorax. Cardiac and mediastinal contours are within normal
limits. No acute fracture or lytic or blastic osseous lesions. The
visualized upper abdominal bowel gas pattern is unremarkable.
IMPRESSION: No active cardiopulmonary disease.

## 2024-01-02 ENCOUNTER — Other Ambulatory Visit: Payer: Self-pay

## 2024-01-02 ENCOUNTER — Ambulatory Visit
Admission: RE | Admit: 2024-01-02 | Discharge: 2024-01-02 | Disposition: A | Discharge status: Home / Self Care | Source: Ambulatory Visit | Attending: Family Medicine | Admitting: Family Medicine

## 2024-01-02 VITALS — BP 117/82 | HR 100 | Temp 98.2°F | Resp 16

## 2024-01-02 DIAGNOSIS — J8410 Pulmonary fibrosis, unspecified: Secondary | ICD-10-CM

## 2024-01-02 DIAGNOSIS — U071 COVID-19: Secondary | ICD-10-CM | POA: Diagnosis not present

## 2024-01-02 LAB — POC SARS CORONAVIRUS 2 AG -  ED: SARS Coronavirus 2 Ag: POSITIVE — AB

## 2024-01-02 LAB — POCT INFLUENZA A/B
Influenza A, POC: NEGATIVE
Influenza B, POC: NEGATIVE

## 2024-01-02 MED ORDER — PAXLOVID (300/100) 20 X 150 MG & 10 X 100MG PO TBPK: 3.0000 | ORAL_TABLET | Freq: Two times a day (BID) | ORAL | 0 refills | Status: AC

## 2024-01-02 NOTE — ED Provider Notes (Addendum)
 [facility]    CSN: [id number] Arrival date & time: 01/02/24  1053      History   Chief Complaint Chief Complaint  Patient presents with   Nasal Congestion    Entered by patient    HPI Demetry Parmeter is a 68 y.o. male.   HPI  Patient obtains most of his medical care through internal medicine at [facility].  I did copy and paste his medical records below so that I would have a better understanding of this patient's medical conditions.  He is here because he had a cough starting Friday, and Saturday and today.  States has had a high fever.  Body aches.  Fatigue and weakness.  Chest does not hurt.  No shortness of breath or difficulty breathing.  Has minor runny nose and sore throat.  Sinus congestion and facial pain.  He is taking Tylenol and Mucinex .  He had COVID before and it was a serious illness requiring hospitalization for over a week.  He also has a cardiologist for history of PSVT.  His cardiologist has encouraged him not to have any more COVID shots because of a risk for myocarditis.  Patient states he has had COVID injections until this last fall.  He denies any exposure to illness.  He states he stays away from people in general.  He does say he has a lot of medical visits, however.  No one else at home is sick.  Past Medical History:  Diagnosis Date   Cerebral palsy (HCC)    High cholesterol    Hypertension    untreated   Problems copied from his medical annual visit with primary care in 07/2023  Medicare annual  2. Essential hypertension, benign  3. History of PSVT (paroxysmal supraventricular tachycardia)  4. Mixed hyperlipidemia  5. Prediabetes  6. Gastroesophageal reflux disease, unspecified whether esophagitis present  7. Cerebral palsy, unspecified type (*)  8. Pulmonary fibrosis (*)  9. 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 No pertinent surgical history.     Home  Medications    Prior to Admission medications   Medication Sig Start Date End Date Taking? Authorizing Provider  nirmatrelvir/ritonavir (PAXLOVID, 300/100,) 20 x 150 MG & 10 x 100MG  TBPK Take 3 tablets by mouth 2 (two) times daily for 5 days. Patient GFR is 65   Take nirmatrelvir (150 mg) two tablets twice daily for 5 days and ritonavir (100 mg) one tablet twice daily for 5 days. 01/02/24 01/07/24 Yes Stephany Ehrich, MD  albuterol  (PROVENTIL  HFA;VENTOLIN  HFA) 108 (90 Base) MCG/ACT inhaler Inhale 2 puffs into the lungs every 4 (four) hours as needed for wheezing or shortness of breath. 07/20/18   Leon Rajas, MD  esomeprazole (NEXIUM) 40 MG capsule Take 40 mg by mouth daily at 12 noon.    [provider]  loratadine  (CLARITIN ) 10 MG tablet Take 1 tablet (10 mg total) by mouth daily. 08/05/19   Hall-Potvin, [place], PA-C  metaxalone (SKELAXIN) 800 MG tablet Take 800 mg by mouth 3 (three) times daily.    [provider]  mometasone  (NASONEX ) 50 MCG/ACT nasal spray Place 2 sprays into the nose daily. 08/05/19   Hall-Potvin, [place], PA-C  pravastatin (PRAVACHOL) 20 MG tablet Take 20 mg by mouth daily.    [provider]  silodosin (RAPAFLO) 8 MG CAPS capsule Take 8 mg by mouth daily with breakfast.    [provider]  Family History History reviewed. No pertinent family history.  Social History Social History   Tobacco Use   Smoking status: Never   Smokeless tobacco: Never  Vaping Use   Vaping status: Never Used  Substance Use Topics   Alcohol use: No   Drug use: No     Allergies   Erythromycin   Review of Systems Review of Systems See HPI  Physical Exam Triage Vital Signs ED Triage Vitals  Encounter Vitals Group     BP 01/02/24 1101 117/82     Systolic BP Percentile --      Diastolic BP Percentile --      Pulse Rate 01/02/24 1101 100     Resp 01/02/24 1101 16     Temp 01/02/24 1101 98.2 F (36.8 C)     Temp src --      SpO2  01/02/24 1101 95 %     Weight --      Height --      Head Circumference --      Peak Flow --      Pain Score 01/02/24 1104 3     Pain Loc --      Pain Education --      Exclude from Growth Chart --    No data found.  Updated Vital Signs BP 117/82   Pulse 100   Temp 98.2 F (36.8 C)   Resp 16   SpO2 95%      Physical Exam Constitutional:      General: He is not in acute distress.    Appearance: Normal appearance. He is well-developed and normal weight.     Comments: Patient has obvious muscular atrophy and diminished use of left arm and left leg.  HENT:     Head: Normocephalic and atraumatic.  Eyes:     Conjunctiva/sclera: Conjunctivae normal.     Pupils: Pupils are equal, round, and reactive to light.  Cardiovascular:     Rate and Rhythm: Normal rate and regular rhythm.     Heart sounds: Normal heart sounds.  Pulmonary:     Effort: Pulmonary effort is normal. No respiratory distress.     Breath sounds: Normal breath sounds.  Abdominal:     General: There is no distension.     Palpations: Abdomen is soft.  Musculoskeletal:        General: Normal range of motion.     Cervical back: Normal range of motion.  Skin:    General: Skin is warm and dry.  Neurological:     Mental Status: He is alert.      UC Treatments / Results  Labs (all labs ordered are listed, but only abnormal results are displayed) Labs Reviewed  POC SARS CORONAVIRUS 2 AG -  ED - Abnormal; Notable for the following components:      Result Value   SARS Coronavirus 2 Ag Positive (*)    All other components within normal limits  POCT INFLUENZA A/B      Initial Impression / Assessment and Plan / UC Course  I have reviewed the triage vital signs and the nursing notes.  Pertinent labs & imaging results that were available during my care of the patient were reviewed by me and considered in my medical decision making (see chart for details).     With patient's underlying medical problems that  AHF believes that he would benefit from Paxlovid.  We discussed the pros and cons extensively.  Patient kept asking "is not safe".  I told him that I think is in his best interest to take this medicine to reduce the severity and length of illness, reduce risk of long COVID.  Especially since he was so sick with his prior COVID infection.  I encouraged him to call his PCP and cardiologist tomorrow to discuss I did look up his most recent labs and verified his GFR was 65 Final Clinical Impressions(s) / UC Diagnoses   Final diagnoses:  COVID-19  Pulmonary fibrosis [facility])     Discharge Instructions      Make sure you are drinking plenty of fluids May take over-the-counter cough and cold medicines if needed May take Tylenol ibuprofen for pain and fever Take Paxlovid 2 times a day for 5 days  Hold Rapaflo (silodosin) while on the Paxlovid You may continue all of your other medications Call your usual doctors on Monday for any additional advice     ED Prescriptions     Medication Sig Dispense Auth. Provider   nirmatrelvir/ritonavir (PAXLOVID, 300/100,) 20 x 150 MG & 10 x 100MG  TBPK Take 3 tablets by mouth 2 (two) times daily for 5 days. Patient GFR is 65   Take nirmatrelvir (150 mg) two tablets twice daily for 5 days and ritonavir (100 mg) one tablet twice daily for 5 days. 30 tablet Stephany Ehrich, MD      PDMP not reviewed this encounter.   Stephany Ehrich, MD 01/02/24 1432    Stephany Ehrich, MD 01/02/24 [phone]

## 2024-01-02 NOTE — ED Triage Notes (Signed)
 Since Friday evening has been sick, had fever up to 106 initially. This morning was 100.2. has had nasal congestion, cough, pain around eyes. Has had tylenol and mucinex .

## 2024-01-02 NOTE — Discharge Instructions (Signed)
 Make sure you are drinking plenty of fluids May take over-the-counter cough and cold medicines if needed May take Tylenol ibuprofen for pain and fever Take Paxlovid 2 times a day for 5 days  Hold Rapaflo (silodosin) while on the Paxlovid You may continue all of your other medications Call your usual doctors on Monday for any additional advice
